# Patient Record
Sex: Female | Born: 1945 | Race: White | Hispanic: No | Marital: Married | Smoking: Never smoker
Health system: Southern US, Community
[De-identification: ages and names within clinical notes are randomized; demographics above are authoritative.]

## PROBLEM LIST (undated history)

## (undated) DIAGNOSIS — M549 Dorsalgia, unspecified: Secondary | ICD-10-CM

## (undated) DIAGNOSIS — Z860100 Personal history of colon polyps, unspecified: Secondary | ICD-10-CM

## (undated) DIAGNOSIS — M1990 Unspecified osteoarthritis, unspecified site: Secondary | ICD-10-CM

## (undated) HISTORY — PX: ABDOMINAL HYSTERECTOMY: SHX81

## (undated) HISTORY — PX: HAMMER TOE SURGERY: SHX385

## (undated) HISTORY — PX: BACK SURGERY: SHX140

## (undated) HISTORY — PX: CHOLECYSTECTOMY: SHX55

## (undated) HISTORY — PX: COLONOSCOPY: SHX174

## (undated) HISTORY — PX: CATARACT EXTRACTION BILATERAL W/ ANTERIOR VITRECTOMY: SHX1304

---

## 2006-07-19 ENCOUNTER — Inpatient Hospital Stay (HOSPITAL_COMMUNITY): Admission: RE | Admit: 2006-07-19 | Discharge: 2006-07-22 | Payer: Self-pay | Admitting: Neurosurgery

## 2007-02-07 ENCOUNTER — Ambulatory Visit (HOSPITAL_COMMUNITY): Admission: RE | Admit: 2007-02-07 | Discharge: 2007-02-07 | Payer: Self-pay | Admitting: Neurosurgery

## 2007-03-14 ENCOUNTER — Ambulatory Visit (HOSPITAL_COMMUNITY): Admission: RE | Admit: 2007-03-14 | Discharge: 2007-03-15 | Payer: Self-pay | Admitting: Neurosurgery

## 2010-05-16 NOTE — Discharge Summary (Signed)
Evelyn Moore, Evelyn Moore                 ACCOUNT NO.:  [id number]   MEDICAL RECORD NO.:  [id number]          PATIENT TYPE:  INP   LOCATION:  3009                         FACILITY:  [facility]   PHYSICIAN:  Payton Doughty, M.D.      DATE OF BIRTH:  06-May-1945   DATE OF ADMISSION:  07/19/2006  DATE OF DISCHARGE:  07/22/2006                               DISCHARGE SUMMARY   ADMISSION DIAGNOSIS:  Spondylosis L4-5.   DISCHARGE DIAGNOSIS:  Spondylosis L4-5.   PROCEDURE:  L4-5 fusion by Dr. Franky Macho.   COMPLICATIONS:  None.  Discharged satisfied and well.   BODY OF TEXT:  This is a 65 year old right-handed white lady with severe  back pain and spondylosis and facet arthropathy at L4-5.  She was  admitted after ascertaining abnormal laboratory values and underwent  fusion by Dr. Franky Macho at L4-5.  Postoperatively she has done well.  Foley was removed first postoperative day.  The PSA was stopped the  second postoperative day, now she put on oral Percocet.  Up, eating, and  voiding normally.  Her strength is 4.  Incision is dry and well-healing.  She will be discharged home in the care of her family.  Her followup  will be in the [facility] in about a week with Dr. Franky Macho.           ______________________________  Payton Doughty, M.D.     MWR/MEDQ  D:  07/22/2006  T:  07/22/2006  Job:  [id number]

## 2010-05-16 NOTE — Op Note (Signed)
NAME:  Evelyn Moore, Evelyn Moore                 ACCOUNT NO.:  [id number]   MEDICAL RECORD NO.:  [id number]          PATIENT TYPE:  INP   LOCATION:  3172                         FACILITY:  [facility]   PHYSICIAN:  Coletta Memos, M.D.     DATE OF BIRTH:  09/13/1945   DATE OF PROCEDURE:  07/19/2006  DATE OF DISCHARGE:                               OPERATIVE REPORT   PREOPERATIVE DIAGNOSES:  1. Lumbar spondylolisthesis, acquired, L4-5.  2. Lumbar stenosis.  3. Lumbar radiculopathy.   POSTOPERATIVE DIAGNOSES:  1. Lumbar spondylolisthesis, acquired, L4-5.  2. Lumbar stenosis.  3. Lumbar radiculopathy.   PROCEDURE:  1. L4-5 posterior lumbar interbody arthrodesis with 13 mm x 2 Synthes      cages.  2. Posterolateral arthrodesis, L4-5 with morselized autograft.  3. Pedicle screw fixation nonsegmental, L4-L5 with a legacy system.   COMPLICATIONS:  None.   SURGEON:  Coletta Memos, M.D.   ASSISTANT:  Payton Doughty, M.D.   INDICATIONS:  Evelyn Moore is a 65 year old who presented with severe  pain in the back and left lower extremity.  She was in such discomfort  that she truly wanted to get this procedure done as soon as she could.  She had the lumbar spondylolisthesis L4 and  L5, incompetent facettes at  L4-5, significant foraminal narrowing, especially on the left L4 nerve  root.  I therefore offered and she agreed to undergo operative  decompression and subsequent fusion.   [name] is brought to the operating room intubated and placed under  general anesthetic without difficulty.  She had a Foley catheter placed  under sterile conditions.  She was rolled prone onto a Wilson frame and  all pressure points were padded.  Back was prepped and she was draped in  a sterile fashion.  I opened the skin with a #10 blade.  I took this  down to the thoracolumbar fascia.  I then exposed the lamina  subsequently of L3, L4, L5 and of the sacrum.  X-ray showed that I was  in the correct position, which I did on two  occasions, one with a probe  into the disk space at L4-5.  I then proceeded to define the transverse  processes of L4 and of L5 and that was done without great difficulty.  I  then proceeded with full decompression of the L4-5 space by doing a  complete laminectomy of L4.  I did this in order to fully decompress  both L4 and L5 nerve roots as I did on both sides.  There was a great  deal of spondylitic change and redundant ligament present.   I then opened the disk space and removed the disk material on both sides  quite aggressively.  I then was able to prepare the endplates for  arthrodesis.  Using both morselized auto and allograft 5 mL of Vitoss, I  packed two 13 mm cages and placed those into the disk space, one on the  right, one on the left without difficulty.  X-ray showed the cages to be  in good position.  I then packed bone  lateral to each of the cages using  all 5 mL of the Vitoss.  I then prepared for the posterolateral  arthrodesis.  I decorticated the lateral bone at L4 and at L5 along with  the transverse processes and placed morselized autograft.   I then placed the posterior instrumentation.  This was done with  fluoroscopic guidance.  I first used a awl and then a pedicle probe and  then a tap.  At all points I did not detect a break out.  X-ray showed  that screws were all in good position.  I used four 45 mm, 5 x 5 screws,  two in L4, two in L5.  I connected those with rod and secured that with  locking screws.  X-ray showed that all screws were in good position.  I  then irrigated the wound.  I then closed the wound in a layered fashion  using Vicryl sutures to reapproximate the thoracolumbar, subcutaneous,  subcuticular layers.  Dermabond used for sterile dressing.           ______________________________  Coletta Memos, M.D.     KC/MEDQ  D:  07/19/2006  T:  07/20/2006  Job:  [id number]

## 2010-05-16 NOTE — Op Note (Signed)
NAME:  Evelyn, Moore                 ACCOUNT NO.:  [id number]   MEDICAL RECORD NO.:  [id number]          PATIENT TYPE:  INP   LOCATION:  3523                         FACILITY:  [facility]   PHYSICIAN:  Coletta Memos, M.D.     DATE OF BIRTH:  05-16-1945   DATE OF PROCEDURE:  03/14/2007  DATE OF DISCHARGE:  03/15/2007                               OPERATIVE REPORT   PREOPERATIVE DIAGNOSIS:  Low back pain.   POSTOPERATIVE DIAGNOSIS:  Low back pain.   PROCEDURE:  Hardware removal pedicle screws at L4-5 along with rods.   COMPLICATIONS:  None.   SURGEON:  Coletta Memos, M.D.   ASSISTANT:  None.   ANESTHESIA:  General endotracheal.   INDICATIONS:  Evelyn Moore is a patient who underwent a lumbar fusion  with interbody cages and pedicle screw fixation the summer of 2008.  She  has a solid fusion based on a CT reading, but she continues complained  of bilateral hip pain.  The right L4 screws seem to get into the joint.  I felt that if I removed it she may have some relief of her pain as she  already has a solid fusion.  She agreed and is brought to the operating  room today for the procedure.   OPERATIVE NOTE:  Evelyn Moore was brought to the operating room, intubated,  placed under general anesthetic.  She was rolled prone onto a Wilson  frame, all pressure points properly padded.  Back prepped.  She was  draped in a sterile fashion.  I opened the skin with a #10 blade and  took this down to the thoracolumbar fascia.  I then exposed the lamina  of L3 and some of L5.  Using x-ray, I was then able to localize the  hardware.  I removed the hardware on both sides without difficulty.  I  then injected 40 mL 0.5% lidocaine 1:200,000 strength epinephrine into  the paraspinous musculature and subcutaneous tissue.  I then closed the  wound in sterile fashion using Vicryl sutures to reapproximate the  thoracolumbar fascia, subcutaneous, subcuticular layers.  I used  Dermabond for sterile dressing.           ______________________________  Coletta Memos, M.D.     KC/MEDQ  D:  03/14/2007  T:  03/15/2007  Job:  [id number]

## 2010-09-25 LAB — BASIC METABOLIC PANEL
BUN: 13
CO2: 31
Calcium: 9.5
Chloride: 100
Creatinine, Ser: 0.87
GFR calc Af Amer: >60
Glucose, Bld: 120 — ABNORMAL HIGH

## 2010-09-25 LAB — CBC
MCHC: 35.3
MCV: 93.2
Platelets: 173
RBC: 4.79
RDW: 13.7

## 2010-10-16 LAB — POTASSIUM: Potassium: 3.3 — ABNORMAL LOW

## 2010-10-17 LAB — CBC
HCT: 43.5
MCHC: 35.4
MCV: 94.5
Platelets: 204
WBC: 11.6 — ABNORMAL HIGH

## 2010-10-17 LAB — BASIC METABOLIC PANEL
BUN: 17
CO2: 31
Chloride: 98
Creatinine, Ser: 0.85
Potassium: 3 — ABNORMAL LOW

## 2010-10-17 LAB — ABO/RH: ABO/RH(D): A POS

## 2010-10-17 LAB — TYPE AND SCREEN: ABO/RH(D): A POS

## 2012-02-06 ENCOUNTER — Other Ambulatory Visit (HOSPITAL_COMMUNITY): Payer: Self-pay | Admitting: Neurological Surgery

## 2012-02-06 DIAGNOSIS — M545 Low back pain: Secondary | ICD-10-CM

## 2012-02-11 ENCOUNTER — Encounter (HOSPITAL_COMMUNITY): Payer: Self-pay | Admitting: *Deleted

## 2012-02-11 NOTE — Progress Notes (Signed)
Pt doesn't have a cardiologist  Denies ever having an echo/stress test/heart cath  Medical Md is in [place]-Dr.Dwight [place]  Denies EKG or CXR in past yr

## 2012-02-12 ENCOUNTER — Encounter (HOSPITAL_COMMUNITY): Payer: Self-pay | Admitting: Anesthesiology

## 2012-02-12 ENCOUNTER — Encounter (HOSPITAL_COMMUNITY)
Admission: RE | Disposition: A | Discharge status: Home / Self Care | Payer: Self-pay | Source: Ambulatory Visit | Attending: Neurological Surgery

## 2012-02-12 ENCOUNTER — Ambulatory Visit (HOSPITAL_COMMUNITY)
Admission: RE | Admit: 2012-02-12 | Discharge: 2012-02-12 | Disposition: A | Discharge status: Home / Self Care | Payer: Medicare Other | Source: Ambulatory Visit | Attending: Neurological Surgery | Admitting: Neurological Surgery

## 2012-02-12 ENCOUNTER — Ambulatory Visit (HOSPITAL_COMMUNITY): Payer: Medicare Other | Admitting: Anesthesiology

## 2012-02-12 ENCOUNTER — Encounter (HOSPITAL_COMMUNITY): Payer: Self-pay | Admitting: *Deleted

## 2012-02-12 ENCOUNTER — Encounter (HOSPITAL_COMMUNITY): Payer: Self-pay

## 2012-02-12 DIAGNOSIS — M545 Low back pain, unspecified: Secondary | ICD-10-CM | POA: Insufficient documentation

## 2012-02-12 DIAGNOSIS — M519 Unspecified thoracic, thoracolumbar and lumbosacral intervertebral disc disorder: Secondary | ICD-10-CM | POA: Insufficient documentation

## 2012-02-12 DIAGNOSIS — M5126 Other intervertebral disc displacement, lumbar region: Secondary | ICD-10-CM | POA: Insufficient documentation

## 2012-02-12 DIAGNOSIS — Z981 Arthrodesis status: Secondary | ICD-10-CM | POA: Insufficient documentation

## 2012-02-12 DIAGNOSIS — M51379 Other intervertebral disc degeneration, lumbosacral region without mention of lumbar back pain or lower extremity pain: Secondary | ICD-10-CM | POA: Insufficient documentation

## 2012-02-12 DIAGNOSIS — M5137 Other intervertebral disc degeneration, lumbosacral region: Secondary | ICD-10-CM | POA: Insufficient documentation

## 2012-02-12 HISTORY — DX: Unspecified osteoarthritis, unspecified site: M19.90

## 2012-02-12 HISTORY — DX: Personal history of colon polyps, unspecified: Z86.0100

## 2012-02-12 HISTORY — DX: Personal history of colonic polyps: Z86.010

## 2012-02-12 HISTORY — DX: Dorsalgia, unspecified: M54.9

## 2012-02-12 HISTORY — PX: RADIOLOGY WITH ANESTHESIA: SHX6223

## 2012-02-12 SURGERY — Surgical Case
Anesthesia: *Unknown

## 2012-02-12 SURGERY — RADIOLOGY WITH ANESTHESIA
Anesthesia: Monitor Anesthesia Care

## 2012-02-12 MED ORDER — LACTATED RINGERS IV SOLN
INTRAVENOUS | Status: DC
  Administered 2012-02-12: 14:00:00 via INTRAVENOUS

## 2012-02-12 MED ORDER — HYDROMORPHONE HCL PF 1 MG/ML IJ SOLN: 0.2500 mg | INTRAMUSCULAR | Status: DC | PRN

## 2012-02-12 MED ORDER — HYDROMORPHONE HCL PF 1 MG/ML IJ SOLN
INTRAMUSCULAR | Status: AC
  Filled 2012-02-12: qty 1

## 2012-02-12 MED ORDER — ONDANSETRON HCL 4 MG/2ML IJ SOLN
INTRAMUSCULAR | Status: AC
  Filled 2012-02-12: qty 2

## 2012-02-12 MED ORDER — MEPERIDINE HCL 25 MG/ML IJ SOLN: 6.2500 mg | INTRAMUSCULAR | Status: DC | PRN

## 2012-02-12 MED ORDER — OXYCODONE HCL 5 MG/5ML PO SOLN: 5.0000 mg | Freq: Once | ORAL | Status: DC | PRN

## 2012-02-12 MED ORDER — PROMETHAZINE HCL 25 MG/ML IJ SOLN: 6.2500 mg | INTRAMUSCULAR | Status: DC | PRN

## 2012-02-12 MED ORDER — ONDANSETRON HCL 4 MG/2ML IJ SOLN
4.0000 mg | Freq: Once | INTRAMUSCULAR | Status: AC | PRN
  Administered 2012-02-12: 4 mg via INTRAVENOUS

## 2012-02-12 MED ORDER — GADOBENATE DIMEGLUMINE 529 MG/ML IV SOLN
20.0000 mL | Freq: Once | INTRAVENOUS | Status: AC
  Administered 2012-02-12: 20 mL via INTRAVENOUS

## 2012-02-12 MED ORDER — OXYCODONE HCL 5 MG PO TABS: 5.0000 mg | ORAL_TABLET | Freq: Once | ORAL | Status: DC | PRN

## 2012-02-12 MED ORDER — HYDROMORPHONE HCL PF 1 MG/ML IJ SOLN
0.2500 mg | INTRAMUSCULAR | Status: DC | PRN
  Administered 2012-02-12: 0.5 mg via INTRAVENOUS

## 2012-02-12 NOTE — Preoperative (Signed)
Beta Blockers   Reason not to administer Beta Blockers:Not Applicable 

## 2012-02-12 NOTE — Anesthesia Preprocedure Evaluation (Addendum)
Anesthesia Evaluation  Patient identified by MRN, date of birth, ID band Patient awake    Reviewed: Allergy & Precautions, H&P , NPO status , Patient's Chart, lab work & pertinent test results, reviewed documented beta blocker date and time , Unable to perform ROS - Chart review only  Airway Mallampati: II TM Distance: >3 FB Neck ROM: Full    Dental  (+) Dental Advisory Given and Teeth Intact   Pulmonary neg pulmonary ROS,    Pulmonary exam normal       Cardiovascular negative cardio ROS      Neuro/Psych negative psych ROS   GI/Hepatic negative GI ROS, Neg liver ROS,   Endo/Other  negative endocrine ROS  Renal/GU negative Renal ROS     Musculoskeletal   Abdominal   Peds  Hematology negative hematology ROS (+)   Anesthesia Other Findings   Reproductive/Obstetrics                         Anesthesia Physical Anesthesia Plan  ASA: II  Anesthesia Plan: MAC   Post-op Pain Management:    Induction: Intravenous  Airway Management Planned: LMA  Additional Equipment:   Intra-op Plan:   Post-operative Plan: Extubation in OR  Informed Consent: I have reviewed the patients History and Physical, chart, labs and discussed the procedure including the risks, benefits and alternatives for the proposed anesthesia with the patient or authorized representative who has indicated his/her understanding and acceptance.   Dental advisory given  Plan Discussed with: CRNA and Surgeon  Anesthesia Plan Comments:       Anesthesia Quick Evaluation

## 2012-02-14 ENCOUNTER — Encounter (HOSPITAL_COMMUNITY): Payer: Self-pay | Admitting: Radiology

## 2012-02-22 ENCOUNTER — Other Ambulatory Visit: Payer: Self-pay | Admitting: Neurological Surgery

## 2012-02-29 ENCOUNTER — Encounter (HOSPITAL_COMMUNITY): Payer: Self-pay | Admitting: Pharmacy Technician

## 2012-03-05 NOTE — Pre-Procedure Instructions (Signed)
Letitia Sabala Weismann  03/05/2012   Your procedure is scheduled on:  Thursday  03/13/12  Report to [facility] at 530  AM.  Call this number if you have problems the morning of surgery: [phone]   Remember:   Do not eat food or drink liquids after midnight.   Take these medicines the morning of surgery with A SIP OF WATER:none   Do not wear jewelry, make-up or nail polish.  Do not wear lotions, powders, or perfumes. You may wear deodorant.  Do not shave 48 hours prior to surgery. Men may shave face and neck.  Do not bring valuables to the hospital.  Contacts, dentures or bridgework may not be worn into surgery.  Leave suitcase in the car. After surgery it may be brought to your room.   For patients admitted to the hospital, checkout time is 11:00 AM the day of  discharge.   Patients discharged the day of surgery will not be allowed to drive  home.     Special Instructions: Shower using CHG 2 nights before surgery and the night before surgery.  If you shower the day of surgery use CHG.  Use special wash - you have one bottle of CHG for all showers.  You should use approximately 1/3 of the bottle for each shower.   Please read over the following fact sheets that you were given: Pain Booklet, Blood Transfusion Information, MRSA Information and Surgical Site Infection Prevention

## 2012-03-06 ENCOUNTER — Encounter (HOSPITAL_COMMUNITY): Payer: Self-pay

## 2012-03-06 ENCOUNTER — Ambulatory Visit (HOSPITAL_COMMUNITY)
Admission: RE | Admit: 2012-03-06 | Discharge: 2012-03-06 | Disposition: A | Discharge status: Home / Self Care | Payer: Medicare Other | Source: Ambulatory Visit | Attending: Neurological Surgery | Admitting: Neurological Surgery

## 2012-03-06 ENCOUNTER — Encounter (HOSPITAL_COMMUNITY)
Admission: RE | Admit: 2012-03-06 | Discharge: 2012-03-06 | Disposition: A | Discharge status: Home / Self Care | Payer: Medicare Other | Source: Ambulatory Visit | Attending: Neurological Surgery | Admitting: Neurological Surgery

## 2012-03-06 DIAGNOSIS — Z01812 Encounter for preprocedural laboratory examination: Secondary | ICD-10-CM | POA: Insufficient documentation

## 2012-03-06 DIAGNOSIS — R9431 Abnormal electrocardiogram [ECG] [EKG]: Secondary | ICD-10-CM | POA: Insufficient documentation

## 2012-03-06 DIAGNOSIS — Z01818 Encounter for other preprocedural examination: Secondary | ICD-10-CM | POA: Insufficient documentation

## 2012-03-06 DIAGNOSIS — Z01810 Encounter for preprocedural cardiovascular examination: Secondary | ICD-10-CM | POA: Insufficient documentation

## 2012-03-06 LAB — TYPE AND SCREEN
ABO/RH(D): A POS
Antibody Screen: NEGATIVE

## 2012-03-06 LAB — PROTIME-INR
INR: 0.97 (ref 0.00–1.49)
Prothrombin Time: 12.8 seconds (ref 11.6–15.2)

## 2012-03-06 LAB — BASIC METABOLIC PANEL
Chloride: 102 mEq/L (ref 96–112)
Creatinine, Ser: 0.95 mg/dL (ref 0.50–1.10)
GFR calc Af Amer: 70 mL/min — ABNORMAL LOW (ref >90)
GFR calc non Af Amer: 60 mL/min — ABNORMAL LOW (ref >90)
Potassium: 3.9 mEq/L (ref 3.5–5.1)

## 2012-03-06 LAB — CBC WITH DIFFERENTIAL/PLATELET
Basophils Absolute: 0 10*3/uL (ref 0.0–0.1)
Basophils Relative: 1 % (ref 0–1)
Eosinophils Absolute: 0.2 10*3/uL (ref 0.0–0.7)
Eosinophils Relative: 2 % (ref 0–5)
HCT: 45.3 % (ref 36.0–46.0)
Hemoglobin: 16.8 g/dL — ABNORMAL HIGH (ref 12.0–15.0)
Lymphocytes Relative: 19 % (ref 12–46)
Lymphs Abs: 1.4 10*3/uL (ref 0.7–4.0)
MCH: 33 pg (ref 26.0–34.0)
MCHC: 37.1 g/dL — ABNORMAL HIGH (ref 30.0–36.0)
MCV: 89 fL (ref 78.0–100.0)
Monocytes Absolute: 0.4 10*3/uL (ref 0.1–1.0)
Monocytes Relative: 6 % (ref 3–12)
Neutro Abs: 5.4 10*3/uL (ref 1.7–7.7)
Neutrophils Relative %: 72 % (ref 43–77)
Platelets: 154 10*3/uL (ref 150–400)
RBC: 5.09 MIL/uL (ref 3.87–5.11)
RDW: 13.4 % (ref 11.5–15.5)
WBC: 7.4 10*3/uL (ref 4.0–10.5)

## 2012-03-06 LAB — SURGICAL PCR SCREEN
MRSA, PCR: NEGATIVE
Staphylococcus aureus: NEGATIVE

## 2012-03-12 MED ORDER — DEXAMETHASONE SODIUM PHOSPHATE 10 MG/ML IJ SOLN
10.0000 mg | INTRAMUSCULAR | Status: AC
  Administered 2012-03-13: 10 mg via INTRAVENOUS
  Filled 2012-03-12: qty 1

## 2012-03-12 MED ORDER — CEFAZOLIN SODIUM-DEXTROSE 2-3 GM-% IV SOLR
2.0000 g | INTRAVENOUS | Status: AC
  Administered 2012-03-13: 2 g via INTRAVENOUS
  Filled 2012-03-12: qty 50

## 2012-03-13 ENCOUNTER — Inpatient Hospital Stay (HOSPITAL_COMMUNITY): Payer: Medicare Other | Admitting: Anesthesiology

## 2012-03-13 ENCOUNTER — Encounter (HOSPITAL_COMMUNITY)
Admission: RE | Disposition: A | Discharge status: Home / Self Care | Payer: Self-pay | Source: Ambulatory Visit | Attending: Neurological Surgery

## 2012-03-13 ENCOUNTER — Inpatient Hospital Stay (HOSPITAL_COMMUNITY)
Admission: RE | Admit: 2012-03-13 | Discharge: 2012-03-14 | DRG: 460 | Disposition: A | Discharge status: Home / Self Care | Payer: Medicare Other | Source: Ambulatory Visit | Attending: Neurological Surgery | Admitting: Neurological Surgery

## 2012-03-13 ENCOUNTER — Encounter (HOSPITAL_COMMUNITY): Payer: Self-pay | Admitting: Anesthesiology

## 2012-03-13 ENCOUNTER — Inpatient Hospital Stay (HOSPITAL_COMMUNITY): Payer: Medicare Other

## 2012-03-13 ENCOUNTER — Encounter (HOSPITAL_COMMUNITY): Payer: Self-pay

## 2012-03-13 DIAGNOSIS — M4120 Other idiopathic scoliosis, site unspecified: Secondary | ICD-10-CM | POA: Diagnosis present

## 2012-03-13 DIAGNOSIS — M51379 Other intervertebral disc degeneration, lumbosacral region without mention of lumbar back pain or lower extremity pain: Principal | ICD-10-CM | POA: Diagnosis present

## 2012-03-13 DIAGNOSIS — Z981 Arthrodesis status: Secondary | ICD-10-CM

## 2012-03-13 DIAGNOSIS — M5137 Other intervertebral disc degeneration, lumbosacral region: Principal | ICD-10-CM | POA: Diagnosis present

## 2012-03-13 LAB — POCT I-STAT 4, (NA,K, GLUC, HGB,HCT)
HCT: 32 % — ABNORMAL LOW (ref 36.0–46.0)
Hemoglobin: 10.9 g/dL — ABNORMAL LOW (ref 12.0–15.0)
Potassium: 3.9 mEq/L (ref 3.5–5.1)
Sodium: 136 mEq/L (ref 135–145)

## 2012-03-13 SURGERY — FOR MAXIMUM ACCESS (MAS) POSTERIOR LUMBAR INTERBODY FUSION (PLIF) 2 LEVEL
Anesthesia: General | Site: Back | Wound class: Clean

## 2012-03-13 MED ORDER — SUCCINYLCHOLINE CHLORIDE 20 MG/ML IJ SOLN: INTRAMUSCULAR | Status: DC | PRN

## 2012-03-13 MED ORDER — PROPOFOL 10 MG/ML IV BOLUS: INTRAVENOUS | Status: DC | PRN

## 2012-03-13 MED ORDER — SODIUM CHLORIDE 0.9 % IR SOLN
Status: DC | PRN
  Administered 2012-03-13: 09:00:00

## 2012-03-13 MED ORDER — ACETAMINOPHEN 10 MG/ML IV SOLN
1000.0000 mg | Freq: Four times a day (QID) | INTRAVENOUS | Status: DC
Start: 2012-03-13 — End: 2012-03-14
  Administered 2012-03-13 – 2012-03-14 (×2): 1000 mg via INTRAVENOUS
  Filled 2012-03-13 (×5): qty 100

## 2012-03-13 MED ORDER — PROPOFOL 10 MG/ML IV BOLUS
INTRAVENOUS | Status: DC | PRN
  Administered 2012-03-13: 200 mg via INTRAVENOUS

## 2012-03-13 MED ORDER — BUPIVACAINE HCL (PF) 0.25 % IJ SOLN
INTRAMUSCULAR | Status: DC | PRN
  Administered 2012-03-13: 7 mL

## 2012-03-13 MED ORDER — CEFAZOLIN SODIUM 1-5 GM-% IV SOLN
1.0000 g | Freq: Three times a day (TID) | INTRAVENOUS | Status: AC
  Administered 2012-03-13 – 2012-03-14 (×2): 1 g via INTRAVENOUS
  Filled 2012-03-13 (×2): qty 50

## 2012-03-13 MED ORDER — THROMBIN 5000 UNITS EX SOLR
OROMUCOSAL | Status: DC | PRN
  Administered 2012-03-13: 10:00:00 via TOPICAL

## 2012-03-13 MED ORDER — LIDOCAINE HCL (CARDIAC) 20 MG/ML IV SOLN
INTRAVENOUS | Status: DC | PRN
  Administered 2012-03-13: 100 mg via INTRAVENOUS

## 2012-03-13 MED ORDER — ACETAMINOPHEN 325 MG PO TABS: 650.0000 mg | ORAL_TABLET | ORAL | Status: DC | PRN

## 2012-03-13 MED ORDER — FENTANYL CITRATE 0.05 MG/ML IJ SOLN
INTRAMUSCULAR | Status: DC | PRN
  Administered 2012-03-13: 50 ug via INTRAVENOUS
  Administered 2012-03-13: 100 ug via INTRAVENOUS
  Administered 2012-03-13: 50 ug via INTRAVENOUS
  Administered 2012-03-13 (×2): 100 ug via INTRAVENOUS
  Administered 2012-03-13: 50 ug via INTRAVENOUS

## 2012-03-13 MED ORDER — ONDANSETRON HCL 4 MG/2ML IJ SOLN: 4.0000 mg | INTRAMUSCULAR | Status: DC | PRN

## 2012-03-13 MED ORDER — METOCLOPRAMIDE HCL 5 MG/ML IJ SOLN
INTRAMUSCULAR | Status: DC | PRN
  Administered 2012-03-13: 10 mg via INTRAVENOUS

## 2012-03-13 MED ORDER — SUCCINYLCHOLINE CHLORIDE 20 MG/ML IJ SOLN
INTRAMUSCULAR | Status: DC | PRN
  Administered 2012-03-13: 140 mg via INTRAVENOUS

## 2012-03-13 MED ORDER — LIDOCAINE HCL (CARDIAC) 20 MG/ML IV SOLN: INTRAVENOUS | Status: DC | PRN

## 2012-03-13 MED ORDER — POTASSIUM CHLORIDE IN NACL 20-0.9 MEQ/L-% IV SOLN
INTRAVENOUS | Status: DC
  Administered 2012-03-13: 22:00:00 via INTRAVENOUS
  Filled 2012-03-13 (×2): qty 1000

## 2012-03-13 MED ORDER — LACTATED RINGERS IV SOLN
INTRAVENOUS | Status: DC | PRN
  Administered 2012-03-13 (×3): via INTRAVENOUS

## 2012-03-13 MED ORDER — SODIUM CHLORIDE 0.9 % IV SOLN: 250.0000 mL | INTRAVENOUS | Status: DC

## 2012-03-13 MED ORDER — SODIUM CHLORIDE 0.9 % IJ SOLN: 3.0000 mL | INTRAMUSCULAR | Status: DC | PRN

## 2012-03-13 MED ORDER — SCOPOLAMINE 1 MG/3DAYS TD PT72
1.0000 | MEDICATED_PATCH | TRANSDERMAL | Status: DC
  Filled 2012-03-13: qty 1

## 2012-03-13 MED ORDER — METOCLOPRAMIDE HCL 5 MG/ML IJ SOLN: 10.0000 mg | Freq: Once | INTRAMUSCULAR | Status: DC | PRN

## 2012-03-13 MED ORDER — CEFAZOLIN SODIUM-DEXTROSE 2-3 GM-% IV SOLR
INTRAVENOUS | Status: AC
  Administered 2012-03-13: 2 g via INTRAVENOUS
  Filled 2012-03-13: qty 50

## 2012-03-13 MED ORDER — CELECOXIB 200 MG PO CAPS
200.0000 mg | ORAL_CAPSULE | Freq: Two times a day (BID) | ORAL | Status: DC
  Administered 2012-03-13: 200 mg via ORAL
  Filled 2012-03-13 (×3): qty 1

## 2012-03-13 MED ORDER — ACETAMINOPHEN 10 MG/ML IV SOLN
INTRAVENOUS | Status: AC
  Administered 2012-03-13: 1000 mg via INTRAVENOUS
  Filled 2012-03-13: qty 100

## 2012-03-13 MED ORDER — BACITRACIN 50000 UNITS IM SOLR
INTRAMUSCULAR | Status: AC
  Filled 2012-03-13: qty 1

## 2012-03-13 MED ORDER — OXYCODONE-ACETAMINOPHEN 5-325 MG PO TABS
1.0000 | ORAL_TABLET | ORAL | Status: DC | PRN
  Administered 2012-03-13: 2 via ORAL
  Filled 2012-03-13: qty 2

## 2012-03-13 MED ORDER — ALBUMIN HUMAN 5 % IV SOLN
INTRAVENOUS | Status: DC | PRN
  Administered 2012-03-13 (×2): via INTRAVENOUS

## 2012-03-13 MED ORDER — MENTHOL 3 MG MT LOZG: 1.0000 | LOZENGE | OROMUCOSAL | Status: DC | PRN

## 2012-03-13 MED ORDER — PROPOFOL INFUSION 10 MG/ML OPTIME
INTRAVENOUS | Status: DC | PRN
  Administered 2012-03-13: 50 ug/kg/min via INTRAVENOUS

## 2012-03-13 MED ORDER — 0.9 % SODIUM CHLORIDE (POUR BTL) OPTIME
TOPICAL | Status: DC | PRN
  Administered 2012-03-13: 1000 mL

## 2012-03-13 MED ORDER — MIDAZOLAM HCL 5 MG/5ML IJ SOLN
INTRAMUSCULAR | Status: DC | PRN
  Administered 2012-03-13 (×2): 1 mg via INTRAVENOUS

## 2012-03-13 MED ORDER — ONDANSETRON HCL 4 MG/2ML IJ SOLN
INTRAMUSCULAR | Status: DC | PRN
  Administered 2012-03-13: 4 mg via INTRAVENOUS

## 2012-03-13 MED ORDER — SODIUM CHLORIDE 0.9 % IV SOLN
INTRAVENOUS | Status: AC
  Filled 2012-03-13: qty 500

## 2012-03-13 MED ORDER — DEXTROSE 5 % IV SOLN
INTRAVENOUS | Status: DC | PRN
  Administered 2012-03-13: 08:00:00 via INTRAVENOUS

## 2012-03-13 MED ORDER — MORPHINE SULFATE 2 MG/ML IJ SOLN
1.0000 mg | INTRAMUSCULAR | Status: DC | PRN
  Filled 2012-03-13: qty 1

## 2012-03-13 MED ORDER — PHENOL 1.4 % MT LIQD: 1.0000 | OROMUCOSAL | Status: DC | PRN

## 2012-03-13 MED ORDER — MORPHINE SULFATE 2 MG/ML IJ SOLN: 1.0000 mg | INTRAMUSCULAR | Status: DC | PRN

## 2012-03-13 MED ORDER — DEXAMETHASONE 4 MG PO TABS
4.0000 mg | ORAL_TABLET | Freq: Four times a day (QID) | ORAL | Status: DC
  Administered 2012-03-13 – 2012-03-14 (×3): 4 mg via ORAL
  Filled 2012-03-13 (×6): qty 1

## 2012-03-13 MED ORDER — SODIUM CHLORIDE 0.9 % IJ SOLN: 3.0000 mL | Freq: Two times a day (BID) | INTRAMUSCULAR | Status: DC

## 2012-03-13 MED ORDER — ARTIFICIAL TEARS OP OINT
TOPICAL_OINTMENT | OPHTHALMIC | Status: DC | PRN
  Administered 2012-03-13: 1 via OPHTHALMIC

## 2012-03-13 MED ORDER — SCOPOLAMINE 1 MG/3DAYS TD PT72
MEDICATED_PATCH | TRANSDERMAL | Status: DC | PRN
  Administered 2012-03-13: 1 via TRANSDERMAL

## 2012-03-13 MED ORDER — THROMBIN 20000 UNITS EX SOLR
OROMUCOSAL | Status: DC | PRN
  Administered 2012-03-13: 09:00:00 via TOPICAL

## 2012-03-13 MED ORDER — SENNA 8.6 MG PO TABS
1.0000 | ORAL_TABLET | Freq: Two times a day (BID) | ORAL | Status: DC
  Administered 2012-03-13 – 2012-03-14 (×2): 8.6 mg via ORAL
  Filled 2012-03-13 (×2): qty 1

## 2012-03-13 MED ORDER — DEXAMETHASONE SODIUM PHOSPHATE 4 MG/ML IJ SOLN
4.0000 mg | Freq: Four times a day (QID) | INTRAMUSCULAR | Status: DC
  Filled 2012-03-13 (×4): qty 1

## 2012-03-13 MED ORDER — ACETAMINOPHEN 650 MG RE SUPP: 650.0000 mg | RECTAL | Status: DC | PRN

## 2012-03-13 SURGICAL SUPPLY — 70 items
5.0x25mm MAS PLIF Screw ×2 IMPLANT
BAG DECANTER FOR FLEXI CONT (MISCELLANEOUS) ×2 IMPLANT
BENZOIN TINCTURE PRP APPL 2/3 (GAUZE/BANDAGES/DRESSINGS) ×2 IMPLANT
BLADE SURG ROTATE 9660 (MISCELLANEOUS) IMPLANT
BONE MATRIX OSTEOCEL PLUS 10CC (Bone Implant) ×2 IMPLANT
BUR MATCHSTICK NEURO 3.0 LAGG (BURR) ×2 IMPLANT
CAGE PLIF MAS 9X8X28-4 LUMBAR (Cage) ×4 IMPLANT
CANISTER SUCTION 2500CC (MISCELLANEOUS) ×2 IMPLANT
CLIP NEUROVISION LG (CLIP) ×2 IMPLANT
CLOTH BEACON ORANGE TIMEOUT ST (SAFETY) ×2 IMPLANT
CONT SPEC 4OZ CLIKSEAL STRL BL (MISCELLANEOUS) ×4 IMPLANT
COVER BACK TABLE 24X17X13 BIG (DRAPES) IMPLANT
COVER TABLE BACK 60X90 (DRAPES) ×2 IMPLANT
Coroent Large 8x9x23 8 degree ×4 IMPLANT
DRAPE C-ARM 42X72 X-RAY (DRAPES) ×2 IMPLANT
DRAPE C-ARMOR (DRAPES) ×2 IMPLANT
DRAPE LAPAROTOMY 100X72X124 (DRAPES) ×2 IMPLANT
DRAPE POUCH INSTRU U-SHP 10X18 (DRAPES) ×2 IMPLANT
DRAPE SURG 17X23 STRL (DRAPES) ×2 IMPLANT
DRESSING TELFA 8X3 (GAUZE/BANDAGES/DRESSINGS) ×2 IMPLANT
DRSG OPSITE 4X5.5 SM (GAUZE/BANDAGES/DRESSINGS) ×6 IMPLANT
DURAPREP 26ML APPLICATOR (WOUND CARE) ×2 IMPLANT
ELECT REM PT RETURN 9FT ADLT (ELECTROSURGICAL) ×2
ELECTRODE REM PT RTRN 9FT ADLT (ELECTROSURGICAL) ×1 IMPLANT
EVACUATOR 1/8 PVC DRAIN (DRAIN) ×2 IMPLANT
GAUZE SPONGE 4X4 16PLY XRAY LF (GAUZE/BANDAGES/DRESSINGS) IMPLANT
GLOVE BIO SURGEON STRL SZ8 (GLOVE) ×4 IMPLANT
GLOVE BIO SURGEON STRL SZ8.5 (GLOVE) ×2 IMPLANT
GLOVE BIOGEL PI IND STRL 7.0 (GLOVE) ×1 IMPLANT
GLOVE BIOGEL PI IND STRL 8 (GLOVE) ×1 IMPLANT
GLOVE BIOGEL PI INDICATOR 7.0 (GLOVE) ×1
GLOVE BIOGEL PI INDICATOR 8 (GLOVE) ×1
GLOVE OPTIFIT SS 8.0 STRL (GLOVE) ×2 IMPLANT
GLOVE SS BIOGEL STRL SZ 6.5 (GLOVE) ×2 IMPLANT
GLOVE SUPERSENSE BIOGEL SZ 6.5 (GLOVE) ×2
GOWN BRE IMP SLV AUR LG STRL (GOWN DISPOSABLE) ×2 IMPLANT
GOWN BRE IMP SLV AUR XL STRL (GOWN DISPOSABLE) ×6 IMPLANT
GOWN STRL REIN 2XL LVL4 (GOWN DISPOSABLE) IMPLANT
HEMOSTAT POWDER KIT SURGIFOAM (HEMOSTASIS) ×2 IMPLANT
KIT BASIN OR (CUSTOM PROCEDURE TRAY) ×2 IMPLANT
KIT NEEDLE NVM5 EMG ELECT (KITS) ×1 IMPLANT
KIT NEEDLE NVM5 EMG ELECTRODE (KITS) ×1
KIT ROOM TURNOVER OR (KITS) ×2 IMPLANT
MILL MEDIUM DISP (BLADE) ×2 IMPLANT
NEEDLE HYPO 25X1 1.5 SAFETY (NEEDLE) ×2 IMPLANT
NS IRRIG 1000ML POUR BTL (IV SOLUTION) ×2 IMPLANT
PACK LAMINECTOMY NEURO (CUSTOM PROCEDURE TRAY) ×2 IMPLANT
PAD ARMBOARD 7.5X6 YLW CONV (MISCELLANEOUS) ×6 IMPLANT
ROD 30MM (Rod) ×2 IMPLANT
ROD 5.5X40MM (Rod) ×4 IMPLANT
ROD PLIF MAS PB SPHERX 30 (Rod) ×2 IMPLANT
SCREW LOCK (Screw) ×8 IMPLANT
SCREW LOCK FXNS SPNE MAS PL (Screw) ×8 IMPLANT
SCREW PLIF MAS 5.0X35 (Screw) ×2 IMPLANT
SCREW SHANK 5.0X30MM (Screw) ×8 IMPLANT
SCREW SHANK 6.5X30 (Screw) ×4 IMPLANT
SCREW TULIP 5.5 (Screw) ×12 IMPLANT
SPONGE LAP 4X18 X RAY DECT (DISPOSABLE) IMPLANT
SPONGE SURGIFOAM ABS GEL 100 (HEMOSTASIS) ×2 IMPLANT
STRIP CLOSURE SKIN 1/2X4 (GAUZE/BANDAGES/DRESSINGS) ×2 IMPLANT
SUT VIC AB 0 CT1 18XCR BRD8 (SUTURE) ×1 IMPLANT
SUT VIC AB 0 CT1 8-18 (SUTURE) ×1
SUT VIC AB 2-0 CP2 18 (SUTURE) ×2 IMPLANT
SUT VIC AB 3-0 SH 8-18 (SUTURE) ×4 IMPLANT
SYR 20ML ECCENTRIC (SYRINGE) ×2 IMPLANT
TOWEL OR 17X24 6PK STRL BLUE (TOWEL DISPOSABLE) ×2 IMPLANT
TOWEL OR 17X26 10 PK STRL BLUE (TOWEL DISPOSABLE) ×2 IMPLANT
TRAP SPECIMEN MUCOUS 40CC (MISCELLANEOUS) ×2 IMPLANT
TRAY FOLEY CATH 14FRSI W/METER (CATHETERS) ×2 IMPLANT
WATER STERILE IRR 1000ML POUR (IV SOLUTION) ×2 IMPLANT

## 2012-03-13 NOTE — Transfer of Care (Signed)
Immediate Anesthesia Transfer of Care Note  Patient: Evelyn Moore  Procedure(s) Performed: Procedure(s) with comments: MAXIMUM ACCESS (MAS) POSTERIOR LUMBAR INTERBODY FUSION (PLIF) 2 LEVEL (C-Arm) (N/A) - Maximum Access Posterior Lumbar Interbody Fusion Lumbar Three-Four, Lumbar Five-Sacral One  Patient Location: PACU  Anesthesia Type:General  Level of Consciousness: oriented, sedated, patient cooperative, responds to stimulation and Patient remains intubated per anesthesia plan  Airway & Oxygen Therapy: Patient Spontanous Breathing, Patient connected to T-piece oxygen and Patient remains intubated per anesthesia plan  Post-op Assessment: Report given to PACU RN and Post -op Vital signs reviewed and stable  Post vital signs: Reviewed and stable  Complications: No apparent anesthesia complications

## 2012-03-13 NOTE — Preoperative (Signed)
Beta Blockers   Reason not to administer Beta Blockers:Not Applicable 

## 2012-03-13 NOTE — Anesthesia Preprocedure Evaluation (Addendum)
Anesthesia Evaluation  Patient identified by MRN, date of birth, ID band Patient awake    Reviewed: Allergy & Precautions, H&P , NPO status , Patient's Chart, lab work & pertinent test results, reviewed documented beta blocker date and time   Airway Mallampati: II TM Distance: >3 FB Neck ROM: full    Dental  (+) Edentulous Upper, Edentulous Lower and Dental Advisory Given   Pulmonary neg pulmonary ROS,  breath sounds clear to auscultation        Cardiovascular negative cardio ROS  Rhythm:regular     Neuro/Psych negative neurological ROS  negative psych ROS   GI/Hepatic negative GI ROS, Neg liver ROS,   Endo/Other  Morbid obesity  Renal/GU negative Renal ROS  negative genitourinary   Musculoskeletal   Abdominal   Peds  Hematology negative hematology ROS (+)   Anesthesia Other Findings See surgeon's H&P  Can't see past tongue  Reproductive/Obstetrics negative OB ROS                          Anesthesia Physical Anesthesia Plan  ASA: III  Anesthesia Plan: General   Post-op Pain Management:    Induction: Intravenous  Airway Management Planned: Oral ETT  Additional Equipment:   Intra-op Plan:   Post-operative Plan: Extubation in OR  Informed Consent: I have reviewed the patients History and Physical, chart, labs and discussed the procedure including the risks, benefits and alternatives for the proposed anesthesia with the patient or authorized representative who has indicated his/her understanding and acceptance.   Dental Advisory Given  Plan Discussed with: CRNA and Surgeon  Anesthesia Plan Comments:         Anesthesia Quick Evaluation

## 2012-03-13 NOTE — H&P (Signed)
Subjective: Patient is a 67 y.o. female admitted for PLIF. Onset of symptoms was several years ago, gradually worsening since that time.  The pain is rated severe, and is located at the across the lower back and radiates to legs. The pain is described as aching and sharp and occurs all day. The symptoms have been progressive. Symptoms are exacerbated by exercise. MRI or CT showed DDD/ stenosis L3-4, L5-S1.   Past Medical History  Diagnosis Date  . Arthritis     back   . Back pain     arthritis  . History of colon polyps     Past Surgical History  Procedure Laterality Date  . Cesarean section       x 2  . Cholecystectomy    . Abdominal hysterectomy    . Back surgery      x 2  . Hammer toe surgery    . Colonoscopy    . Cataract extraction bilateral w/ anterior vitrectomy    . Radiology with anesthesia N/A 02/12/2012    Procedure: RADIOLOGY WITH ANESTHESIA;  Surgeon: Medication Radiologist, MD;  Location: [facility] OR;  Service: Radiology;  Laterality: N/A;  MRI    Prior to Admission medications   Medication Sig Start Date End Date Taking? Authorizing Provider  CALCIUM PO Take 1 capsule by mouth 2 (two) times daily.   Yes Historical Provider, MD  Multiple Vitamin (MULTIVITAMIN WITH MINERALS) TABS Take 1 tablet by mouth daily.   Yes Historical Provider, MD  vitamin E 400 UNIT capsule Take 800 Units by mouth daily.   Yes Historical Provider, MD   Allergies  Allergen Reactions  . Codeine Nausea And Vomiting  . Dilaudid (Hydromorphone Hcl) Nausea And Vomiting    History  Substance Use Topics  . Smoking status: Never Smoker   . Smokeless tobacco: Not on file  . Alcohol Use: No    History reviewed. No pertinent family history.   Review of Systems  Positive ROS: neg  All other systems have been reviewed and were otherwise negative with the exception of those mentioned in the HPI and as above.  Objective: Vital signs in last 24 hours: Temp:  [97.5 F (36.4 C)] 97.5 F (36.4 C)  (03/13 0621) Pulse Rate:  [78] 78 (03/13 0621) Resp:  [18] 18 (03/13 0621) BP: (139)/(46) 139/46 mmHg (03/13 0621) SpO2:  [96 %] 96 % (03/13 0621)  General Appearance: Alert, cooperative, no distress, appears stated age Head: Normocephalic, without obvious abnormality, atraumatic Eyes: PERRL, conjunctiva/corneas clear, EOM's intact, fundi benign, both eyes      Ears: Normal TM's and external ear canals, both ears Throat: Lips, mucosa, and tongue normal; teeth and gums normal Neck: Supple, symmetrical, trachea midline, no adenopathy; thyroid: No enlargement/tenderness/nodules; no carotid bruit or JVD Back: Symmetric, no curvature, ROM normal, no CVA tenderness Lungs: Clear to auscultation bilaterally, respirations unlabored Heart: Regular rate and rhythm, S1 and S2 normal, no murmur, rub or gallop Abdomen: Soft, non-tender, bowel sounds active all four quadrants, no masses, no organomegaly Extremities: Extremities normal, atraumatic, no cyanosis or edema Pulses: 2+ and symmetric all extremities Skin: Skin color, texture, turgor normal, no rashes or lesions  NEUROLOGIC:   Mental status: Alert and oriented x4,  no aphasia, good attention span, fund of knowledge, and memory Motor Exam - grossly normal Sensory Exam - grossly normal Reflexes: 1+ Coordination - grossly normal Gait - grossly normal Balance - grossly normal Cranial Nerves: I: smell Not tested  II: visual acuity  OS: nl  OD: nl  II: visual fields Full to confrontation  II: pupils Equal, round, reactive to light  III,VII: ptosis None  III,IV,VI: extraocular muscles  Full ROM  V: mastication Normal  V: facial light touch sensation  Normal  V,VII: corneal reflex  Present  VII: facial muscle function - upper  Normal  VII: facial muscle function - lower Normal  VIII: hearing Not tested  IX: soft palate elevation  Normal  IX,X: gag reflex Present  XI: trapezius strength  5/5  XI: sternocleidomastoid strength 5/5  XI:  neck flexion strength  5/5  XII: tongue strength  Normal    Data Review Lab Results  Component Value Date   WBC 7.4 03/06/2012   HGB 16.8* 03/06/2012   HCT 45.3 03/06/2012   MCV 89.0 03/06/2012   PLT 154 03/06/2012   Lab Results  Component Value Date   NA 139 03/06/2012   K 3.9 03/06/2012   CL 102 03/06/2012   CO2 24 03/06/2012   BUN 13 03/06/2012   CREATININE 0.95 03/06/2012   GLUCOSE 117* 03/06/2012   Lab Results  Component Value Date   INR 0.97 03/06/2012    Assessment/Plan: Patient admitted for PLIF. Patient has failed conservative therapy.  I explained the condition and procedure to the patient and answered any questions.  Patient wishes to proceed with procedure as planned. Understands risks/ benefits and typical outcomes of procedure.   JONES,Pieper Kasik S 03/13/2012 7:41 AM

## 2012-03-13 NOTE — Anesthesia Postprocedure Evaluation (Signed)
  Anesthesia Post-op Note  Patient: Evelyn Moore  Procedure(s) Performed: Procedure(s) with comments: MAXIMUM ACCESS (MAS) POSTERIOR LUMBAR INTERBODY FUSION (PLIF) 2 LEVEL (C-Arm) (N/A) - Maximum Access Posterior Lumbar Interbody Fusion Lumbar Three-Four, Lumbar Five-Sacral One  Patient Location: PACU  Anesthesia Type:General  Level of Consciousness: oriented and patient cooperative  Airway and Oxygen Therapy: Patient connected to nasal cannula oxygen  Post-op Pain: mild  Post-op Assessment: Post-op Vital signs reviewed, Patient's Cardiovascular Status Stable, Respiratory Function Stable, Patent Airway, No signs of Nausea or vomiting and Pain level controlled  Post-op Vital Signs: Reviewed and stable  Complications: No apparent anesthesia complications

## 2012-03-13 NOTE — Anesthesia Procedure Notes (Signed)
Procedure Name: Intubation Date/Time: 03/13/2012 7:57 AM Performed by: Marni Griffon Pre-anesthesia Checklist: Patient identified, Emergency Drugs available, Suction available and Patient being monitored Patient Re-evaluated:Patient Re-evaluated prior to inductionOxygen Delivery Method: Circle system utilized Preoxygenation: Pre-oxygenation with 100% oxygen Intubation Type: IV induction Ventilation: Two handed mask ventilation required Laryngoscope Size: Mac and 3 Grade View: Grade II Tube type: Oral Tube size: 7.5 mm Number of attempts: 1 Airway Equipment and Method: Stylet Placement Confirmation: ETT inserted through vocal cords under direct vision,  breath sounds checked- equal and bilateral and positive ETCO2 Secured at: 21 (cm at gum) cm Tube secured with: Tape Dental Injury: Teeth and Oropharynx as per pre-operative assessment

## 2012-03-13 NOTE — Op Note (Signed)
03/13/2012  1:17 PM  PATIENT:  Evelyn Moore  67 y.o. female  PRE-OPERATIVE DIAGNOSIS:  1. Scoliosis, lumbar, 2. 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 Degenerative disc disease L3-4 L5-S1, 4. 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 Decompressive lumbar laminectomy L3-4 and L5-S1 requiring more work than would be required of the typical PLIF procedure in order to adequately decompress the neural elements. The L3, L4, L5, and S1 nerve roots on either decompression. 2. Posterior lumbar interbody fusion L3-4 and L5-S1 using PEEK interbody cages packed with morcellized allograft and autograft  3. Posterior fixation L3-4 and L5-S1 using pedicle screws.    SURGEON:  Marikay Alar, MD  ASSISTANTS: Dr. Lovell Sheehan  ANESTHESIA:  General  EBL: 600 ml  Total I/O In: 2890 [I.V.:2050; Blood:340; IV Piggyback:500] Out: 750 [Urine:150; Blood:600]  BLOOD ADMINISTERED:200 CC PRBC  DRAINS: Hemovac   INDICATION FOR PROCEDURE: This patient had a previous decompression and fusion at L4-5 in the remote past. She had progressive pain in the back and legs. MRI showed progressive degenerative disc disease and stenosis at L3-4 L5-S1. Plain films suggest a flat back syndrome as well as lateral listhesis and scoliosis. Recommended a decompression and fusion at L3-4 and L5-S1. Patient understood the risks, benefits, and alternatives and potential outcomes and wished to proceed.  PROCEDURE DETAILS:  The patient was brought to the operating room. After induction of generalized endotracheal anesthesia the patient was rolled into the prone position on chest rolls and all pressure points were padded. The patient's lumbar region was cleaned and then prepped with DuraPrep and draped in the usual sterile fashion. Anesthesia was injected and then a dorsal midline incision was made and carried down to the lumbosacral fascia. The fascia was opened and the paraspinous musculature was taken down in  a subperiosteal fashion to expose L3-S1. Intraoperative fluoroscopy confirmed my level, and I started by placement of the L3 and L5 cortical pedicle screws. The pedicle screw entry zones were identified utilizing surface landmarks and AP and lateral fluoroscopy. I drilled in an upward and outward direction with the hand drill followed by tapping with the 5 mm utilizing EMG monitoring. 50 by 30 mm pedicle screws were placed at L3 and L5 bilaterally. We then checked our construct with AP and lateral fluoroscopy. I then turned my attention to the decompression L3-4 and L5-S1 and the spinous process was removed and complete lumbar laminectomies, hemi- facetectomies, and foraminotomies were performed at L3-4 and L5-S1. The yellow ligament was removed to expose the underlying dura and nerve roots, and generous foraminotomies were performed to adequately decompress the neural elements at both levels. I decompress the L3 and L4 nerve roots as well as the L5 and S1 nerve roots distally into their respective foramina. Once the decompression was complete, I turned my attention to the posterior lower lumbar interbody fusion. The epidural venous vasculature was coagulated and cut sharply. Disc space was incised and the initial discectomy was performed with pituitary rongeurs. The disc space was distracted with sequential distractors to a height of 8 mm. We then used a series of scrapers and shavers to prepare the endplates for fusion. The midline was prepared with Epstein curettes. Once the complete discectomy was finished, we packed an appropriate sized peek interbody cage with local autograft and morcellized allograft, gently retracted the nerve root, and tapped the cage into position at L3-4 and L5-S1 bilaterally.  The midline was packed with morselized autograft and allograft. We then turned  our attention to the posterior fixation at L4 and S1. The pedicle screw entry zones were identified utilizing surface landmarks and  fluoroscopy. We probed each pedicle with the pedicle probe and tapped each pedicle with the appropriate tap. We palpated with a ball probe to assure no break in the cortex. We then placed 50 by 30 mm pedicle screws into the L4 pedicles bilaterally and 6 5 x 35 mm pedicle screws into the pedicles bilaterally at S1. We then placed lordotic rods into the multiaxial screw heads of the pedicle screws and locked these in position with the locking caps and anti-torque device.  We then checked our construct with AP and lateral fluoroscopy. Irrigated with copious amounts of bacitracin-containing saline solution. Placed a medium Hemovac drain through separate stab incision. Inspected the nerve roots once again to assure adequate decompression, lined to the dura with Gelfoam, and closed the muscle and the fascia with 0 Vicryl. Closed the subcutaneous tissues with 2-0 Vicryl and subcuticular tissues with 3-0 Vicryl. The skin was closed with benzoin and Steri-Strips. Dressing was then applied, the patient was awakened from general anesthesia and transported to the recovery room in stable condition. At the end of the procedure all sponge, needle and instrument counts were correct.   PLAN OF CARE: Admit to inpatient   PATIENT DISPOSITION:  PACU - hemodynamically stable.   Delay start of Pharmacological VTE agent (>24hrs) due to surgical blood loss or risk of bleeding:  yes

## 2012-03-14 LAB — CBC
HCT: 35.5 % — ABNORMAL LOW (ref 36.0–46.0)
Hemoglobin: 13 g/dL (ref 12.0–15.0)
MCH: 32.8 pg (ref 26.0–34.0)
MCHC: 36.6 g/dL — ABNORMAL HIGH (ref 30.0–36.0)
MCV: 89.6 fL (ref 78.0–100.0)

## 2012-03-14 NOTE — Progress Notes (Signed)
Inpatient Diabetes Program Recommendations  AACE/ADA: New Consensus Statement on Inpatient Glycemic Control (2013)  Target Ranges:  Prepandial:   less than 140 mg/dL      Peak postprandial:   less than 180 mg/dL (1-2 hours)      Critically ill patients:  140 - 180 mg/dL   Reason for Visit: Lab glucose of 183 mg/dl.   Inpatient Diabetes Program Recommendations HgbA1C: Elevated lab glucose of 183 mg/dl -- non-fasting.  Please consider ordering a Hgb A1C.  No known history of diabetes.  Note:  Patient receiving Decadron 4mg  qid which can contribute to elevated glucose.  Please consider ordering CBG's ac and HS while in the hospital in addition to Hgb A1C.  Thank you.  Patti S. Elsie Lincoln, RN, CNS, CDE Inpatient Diabetes Program, team pager [phone]

## 2012-03-14 NOTE — Progress Notes (Signed)
UR COMPLETED  

## 2012-03-14 NOTE — Discharge Summary (Signed)
Physician Discharge Summary  Patient ID: Evelyn Moore MRN: [id number] DOB/AGE: [birth year] 67 y.o.  Admit date: 03/13/2012 Discharge date: 03/14/2012  Admission Diagnoses: adjacent level stenosis    Discharge Diagnoses: same   Discharged Condition: good  Hospital Course: The patient was admitted on 03/13/2012 and taken to the operating room where the patient underwent PLIF L3-4, L5-s1. The patient tolerated the procedure well and was taken to the recovery room and then to the floor in stable condition. The hospital course was routine. There were no complications. The wound remained clean dry and intact. Pt had appropriate back soreness. No complaints of leg pain or new N/T/W. The patient remained afebrile with stable vital signs, and tolerated a regular diet. The patient continued to increase activities, and pain was well controlled with oral pain medications.   Consults: None  Significant Diagnostic Studies:  Results for orders placed during the hospital encounter of 03/13/12  CBC      Result Value Range   WBC 10.2  4.0 - 10.5 K/uL   RBC 3.96  3.87 - 5.11 MIL/uL   Hemoglobin 13.0  12.0 - 15.0 g/dL   HCT 14.7 (*) 82.9 - 56.2 %   MCV 89.6  78.0 - 100.0 fL   MCH 32.8  26.0 - 34.0 pg   MCHC 36.6 (*) 30.0 - 36.0 g/dL   RDW 13.0  86.5 - 78.4 %   Platelets 140 (*) 150 - 400 K/uL  POCT I-STAT 4, (NA,K, GLUC, HGB,HCT)      Result Value Range   Sodium 136  135 - 145 mEq/L   Potassium 3.9  3.5 - 5.1 mEq/L   Glucose, Bld 183 (*) 70 - 99 mg/dL   HCT 69.6 (*) 29.5 - 28.4 %   Hemoglobin 10.9 (*) 12.0 - 15.0 g/dL    Chest 2 View  01/03/2438  *RADIOLOGY REPORT*  Clinical Data: Preop.  CHEST - 2 VIEW  Comparison: None.  Findings: Trachea is midline.  Heart size normal.  Lungs are clear. No pleural fluid.  IMPRESSION: No acute findings.   Original Report Authenticated By: Leanna Battles, M.D.    Dg Lumbar Spine Complete  03/13/2012  *RADIOLOGY REPORT*  Clinical Data: L3-4 and L5-S1 fusion.   DG C-ARM GT 120 MIN, LUMBAR SPINE - COMPLETE 4+ VIEW  Comparison: MRI lumbar spine 02/12/2012.  Findings: We are provided with two fluoroscopic intraoperative spot views of the lumbar spine.  Images demonstrate new pedicle screws and stabilization bars at L3-4 and L5-S1.  Prior L4-5 fusion noted.  IMPRESSION: L3-4 and L5-S1 fusion.   Original Report Authenticated By: Holley Dexter, M.D.    Dg C-arm Gt 120 Min  03/13/2012  *RADIOLOGY REPORT*  Clinical Data: L3-4 and L5-S1 fusion.  DG C-ARM GT 120 MIN, LUMBAR SPINE - COMPLETE 4+ VIEW  Comparison: MRI lumbar spine 02/12/2012.  Findings: We are provided with two fluoroscopic intraoperative spot views of the lumbar spine.  Images demonstrate new pedicle screws and stabilization bars at L3-4 and L5-S1.  Prior L4-5 fusion noted.  IMPRESSION: L3-4 and L5-S1 fusion.   Original Report Authenticated By: Holley Dexter, M.D.     Antibiotics:  Anti-infectives   Start     Dose/Rate Route Frequency Ordered Stop   03/13/12 2200  ceFAZolin (ANCEF) IVPB 1 g/50 mL premix     1 g 100 mL/hr over 30 Minutes Intravenous Every 8 hours 03/13/12 1949 03/14/12 0538   03/13/12 1158  ceFAZolin (ANCEF) 2-3 GM-% IVPB SOLR    Comments:  Reece Packer: cabinet override      03/13/12 1158 03/13/12 1200   03/13/12 0848  bacitracin 50,000 Units in sodium chloride irrigation 0.9 % 500 mL irrigation  Status:  Discontinued       As needed 03/13/12 0848 03/13/12 1331   03/13/12 0710  bacitracin 16109 UNITS injection    Comments:  Reece Packer: cabinet override      03/13/12 0710 03/13/12 1914   03/13/12 0600  ceFAZolin (ANCEF) IVPB 2 g/50 mL premix     2 g 100 mL/hr over 30 Minutes Intravenous On call to O.R. 03/12/12 1422 03/13/12 0750      Discharge Exam: Blood pressure 128/43, pulse 80, temperature 98.2 F (36.8 C), temperature source Oral, resp. rate 17, height 5\' 2"  (1.575 m), weight 101.197 kg (223 lb 1.6 oz), SpO2 98.00%. Neurologic: Grossly normal Incision  CDI  Discharge Medications:     Medication List    TAKE these medications       CALCIUM PO  Take 1 capsule by mouth 2 (two) times daily.     multivitamin with minerals Tabs  Take 1 tablet by mouth daily.     vitamin E 400 UNIT capsule  Take 800 Units by mouth daily.        Disposition: home   Final Dx: PLIF L3-4, L5-S1      Discharge Orders   Future Orders Complete By Expires     Call MD for:  difficulty breathing, headache or visual disturbances  As directed     Call MD for:  persistant nausea and vomiting  As directed     Call MD for:  redness, tenderness, or signs of infection (pain, swelling, redness, odor or green/yellow discharge around incision site)  As directed     Call MD for:  severe uncontrolled pain  As directed     Call MD for:  temperature >100.4  As directed     Diet - low sodium heart healthy  As directed     Discharge instructions  As directed     Comments:      No bending, twisting, or heavy lifting. Take it easy. May shower. No driving.    Increase activity slowly  As directed        Follow-up Information   Follow up with Ehren Berisha,DAVID S, MD. Schedule an appointment as soon as possible for a visit in 2 weeks.   Contact information:   [address] [place] [address] [phone]        Signed: Tia Alert 03/14/2012, 12:59 PM

## 2012-03-14 NOTE — Evaluation (Signed)
Physical Therapy Evaluation Patient Details Name: Evelyn Moore MRN: [id number] DOB: 20-Jan-1945 Today's Date: 03/14/2012 Time: 2956-2130 PT Time Calculation (min): 30 min  PT Assessment / Plan / Recommendation Clinical Impression  Pt. is s/p 2 level PLIF with h/o 2 previous back surgeries.  She presents to PT with her pain well managed (reports 2/10 level with no pain med since last pm).  She also has a decrease in her usual functional activity and gait level and will benefit from PT to address these and below issues.      PT Assessment  Patient needs continued PT services    Follow Up Recommendations  No PT follow up    Does the patient have the potential to tolerate intense rehabilitation      Barriers to Discharge None      Equipment Recommendations  Rolling walker with 5" wheels    Recommendations for Other Services     Frequency Min 5X/week    Precautions / Restrictions Precautions Precautions: Back Precaution Booklet Issued: Yes (comment) (pt. deucated on back precautions and log rolling techcnique) Precaution Comments: this is pt's 3rd back surgery, by her report Required Braces or Orthoses: Spinal Brace Spinal Brace: Lumbar corset;Applied in sitting position Restrictions Weight Bearing Restrictions: No   Pertinent Vitals/Pain 2/10 in low back; no med requested but pt. Positioned for comfort after gait training      Mobility  Bed Mobility Bed Mobility: Not assessed (pt. already up in chair) Transfers Transfers: Sit to Stand;Stand to Sit Sit to Stand: 4: Min guard;From chair/3-in-1;With armrests Stand to Sit: 4: Min guard;To chair/3-in-1;With armrests Details for Transfer Assistance: cues for safe technique and hand placement Ambulation/Gait Ambulation/Gait Assistance: 4: Min guard Ambulation Distance (Feet): 100 Feet Assistive device: Rolling walker Ambulation/Gait Assistance Details: cues to stand as erect as possible; tends to flex trunk and lean toward  right as was her pre-op gait pattern by her report Gait Pattern: Step-through pattern;Trunk flexed Gait velocity: slowed Stairs: No    Exercises     PT Diagnosis: Difficulty walking;Acute pain;Abnormality of gait  PT Problem List: Decreased activity tolerance;Decreased mobility;Decreased knowledge of use of DME;Decreased knowledge of precautions;Pain PT Treatment Interventions: DME instruction;Gait training;Stair training;Functional mobility training;Patient/family education;Therapeutic activities   PT Goals Acute Rehab PT Goals PT Goal Formulation: With patient Time For Goal Achievement: 03/21/12 Potential to Achieve Goals: Good Pt will Roll Supine to Right Side: with modified independence PT Goal: Rolling Supine to Right Side - Progress: Goal set today Pt will Roll Supine to Left Side: with modified independence PT Goal: Rolling Supine to Left Side - Progress: Goal set today Pt will go Supine/Side to Sit: with modified independence PT Goal: Supine/Side to Sit - Progress: Goal set today Pt will go Sit to Stand: with modified independence PT Goal: Sit to Stand - Progress: Goal set today Pt will go Stand to Sit: with modified independence PT Goal: Stand to Sit - Progress: Goal set today Pt will Ambulate: >150 feet;with modified independence;with least restrictive assistive device PT Goal: Ambulate - Progress: Goal set today Pt will Go Up / Down Stairs: 1-2 stairs;with min assist;with least restrictive assistive device PT Goal: Up/Down Stairs - Progress: Goal set today Additional Goals Additional Goal #1: Pt. will state and comply with 3/3 back precautions and log rolling technique PT Goal: Additional Goal #1 - Progress: Goal set today  Visit Information  Last PT Received On: 03/14/12 Assistance Needed: +1    Subjective Data  Subjective: "This makes the thrid  dtime I have had back surgery" Patient Stated Goal: Housecleaning, cooking, riding with husband on his calls"   Prior  Functioning  Home Living Lives With: Spouse Available Help at Discharge: Family;Available 24 hours/day Type of Home: House Home Access: Ramped entrance;Other (comment) (1 step to enter from ramp) Home Layout: Two level;Bed/bath upstairs;Able to live on main level with bedroom/bathroom Bathroom Shower/Tub: Walk-in shower;Curtain;Other (comment) (not currently working) Firefighter: Pharmacist, community: Yes How Accessible: Accessible via walker Home Adaptive Equipment: Bedside commode/3-in-1;Shower chair with back;Walker - standard;Quad cane Prior Function Level of Independence: Independent with assistive device(s) Able to Take Stairs?: No Driving: No Vocation: Retired Musician: No difficulties Dominant Hand: Right    Cognition  Cognition Overall Cognitive Status: Appears within functional limits for tasks assessed/performed Arousal/Alertness: Awake/alert Orientation Level: Oriented X4 / Intact Behavior During Session: [facility] for tasks performed    Extremity/Trunk Assessment Right Upper Extremity Assessment RUE ROM/Strength/Tone: [facility] for tasks assessed Left Upper Extremity Assessment LUE ROM/Strength/Tone: WFL for tasks assessed Right Lower Extremity Assessment RLE ROM/Strength/Tone: WFL for tasks assessed RLE Sensation: WFL - Light Touch RLE Coordination: WFL - gross motor Left Lower Extremity Assessment LLE ROM/Strength/Tone: WFL for tasks assessed LLE Sensation: WFL - Light Touch LLE Coordination: WFL - gross motor Trunk Assessment Trunk Assessment: Normal   Balance    End of Session PT - End of Session Equipment Utilized During Treatment: Gait belt;Back brace Activity Tolerance: Patient tolerated treatment well Patient left: in chair;with call bell/phone within reach;Other (comment) (educated pt on limiting her sitting time to 1 hour at a time) Nurse Communication: Mobility status  GP     Ferman Hamming 03/14/2012, 8:49  AM Weldon Picking PT Acute Rehab Services [phone] [place] [phone]

## 2012-03-14 NOTE — Progress Notes (Signed)
ANS and discharge instruction given to pt  , saline lock removed   Reminded of fallow up appointment with Dr Yetta Barre in 2 weeks , Back precaution  and incision care emphasized Pt demonstrated  good understanding. Roll]ing walker delivered. Pt discharged home . Condition at discharge is stable. Azzie Roup RN

## 2012-03-17 MED FILL — Heparin Sodium (Porcine) Inj 1000 Unit/ML: INTRAMUSCULAR | Qty: 30 | Status: AC

## 2012-03-17 MED FILL — Sodium Chloride IV Soln 0.9%: INTRAVENOUS | Qty: 2000 | Status: AC

## 2012-04-29 ENCOUNTER — Other Ambulatory Visit: Payer: Self-pay | Admitting: Neurological Surgery

## 2012-04-29 DIAGNOSIS — M549 Dorsalgia, unspecified: Secondary | ICD-10-CM

## 2012-05-06 ENCOUNTER — Ambulatory Visit
Admission: RE | Admit: 2012-05-06 | Discharge: 2012-05-06 | Disposition: A | Discharge status: Home / Self Care | Payer: Medicare Other | Source: Ambulatory Visit | Attending: Neurological Surgery | Admitting: Neurological Surgery

## 2012-05-06 VITALS — BP 123/53 | HR 63

## 2012-05-06 DIAGNOSIS — M549 Dorsalgia, unspecified: Secondary | ICD-10-CM

## 2012-05-06 MED ORDER — DIAZEPAM 5 MG PO TABS
5.0000 mg | ORAL_TABLET | Freq: Once | ORAL | Status: AC
  Administered 2012-05-06: 5 mg via ORAL

## 2012-05-06 MED ORDER — IOHEXOL 180 MG/ML  SOLN
15.0000 mL | Freq: Once | INTRAMUSCULAR | Status: AC | PRN
  Administered 2012-05-06: 15 mL via INTRATHECAL

## 2014-04-28 IMAGING — CR DG CHEST 2V
2 series · 2 of 2 positions shown · non-contrast
Comparison: None.

CLINICAL DATA: Preop.

CHEST - 2 VIEW

[view not recorded (1 of 2)]
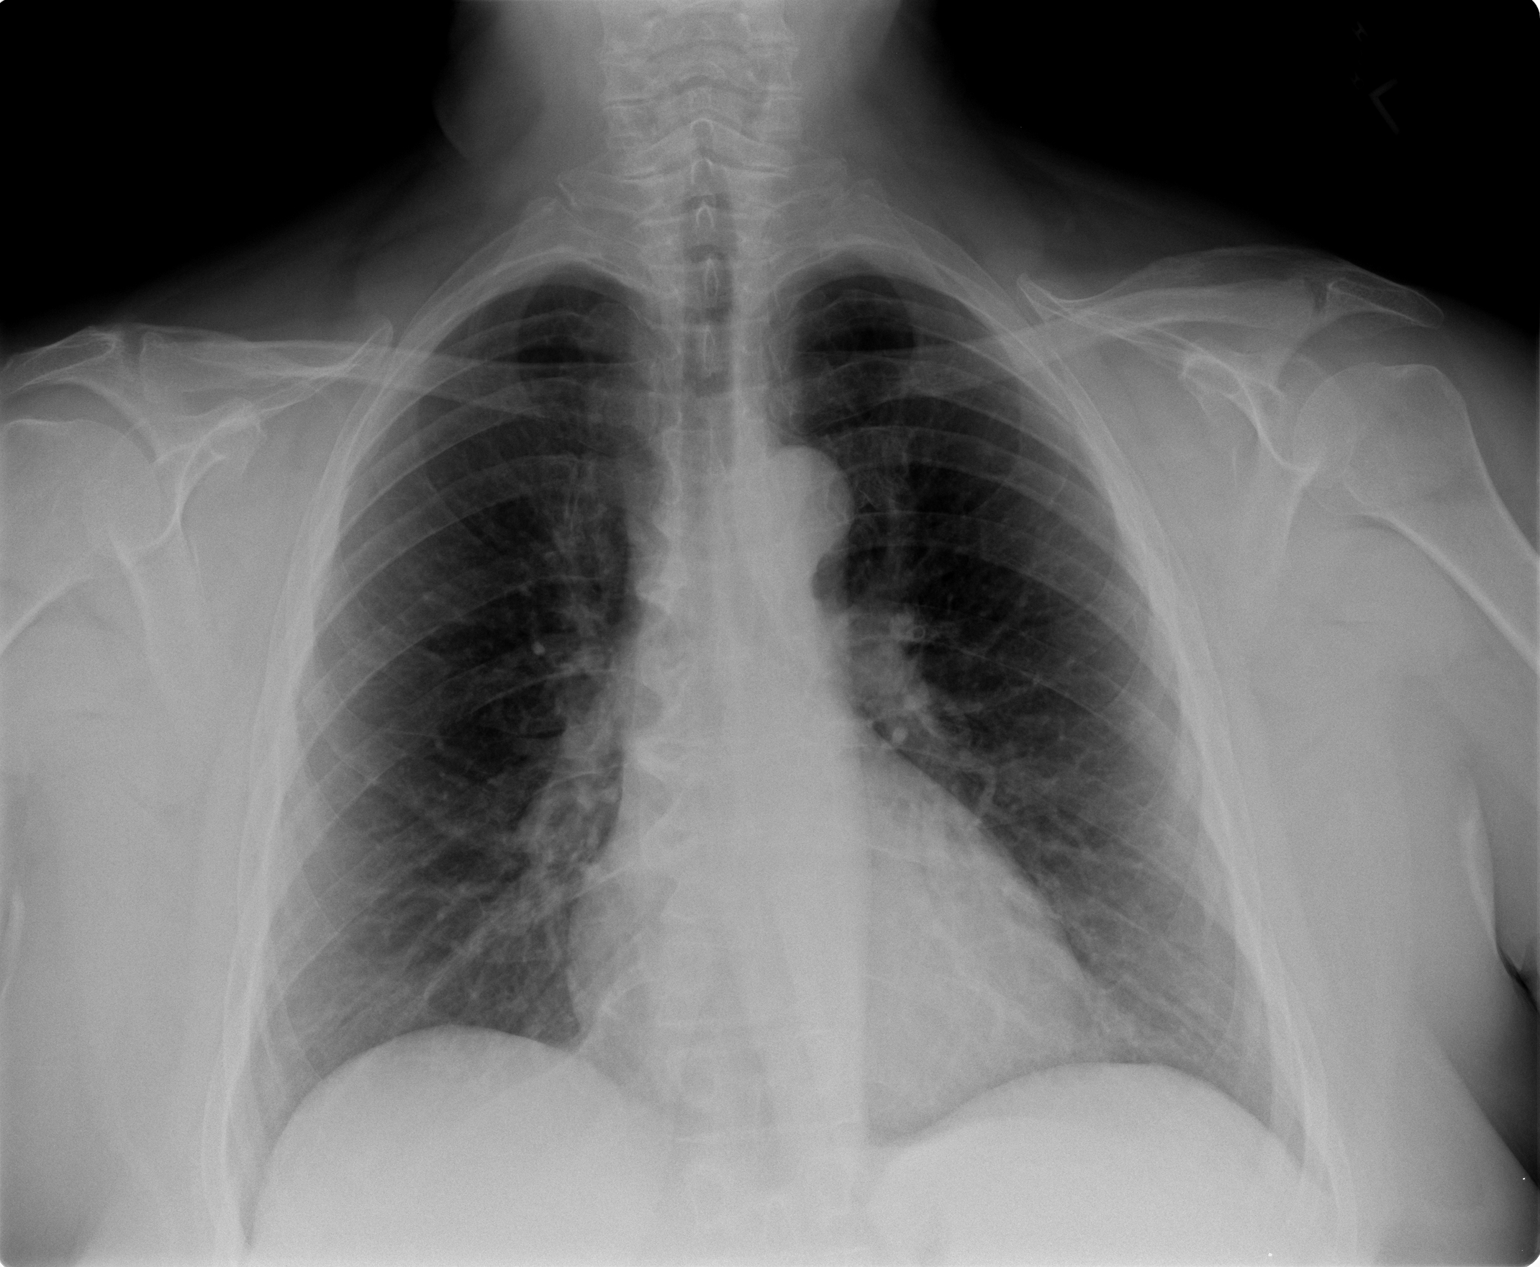

[view not recorded (2 of 2)]
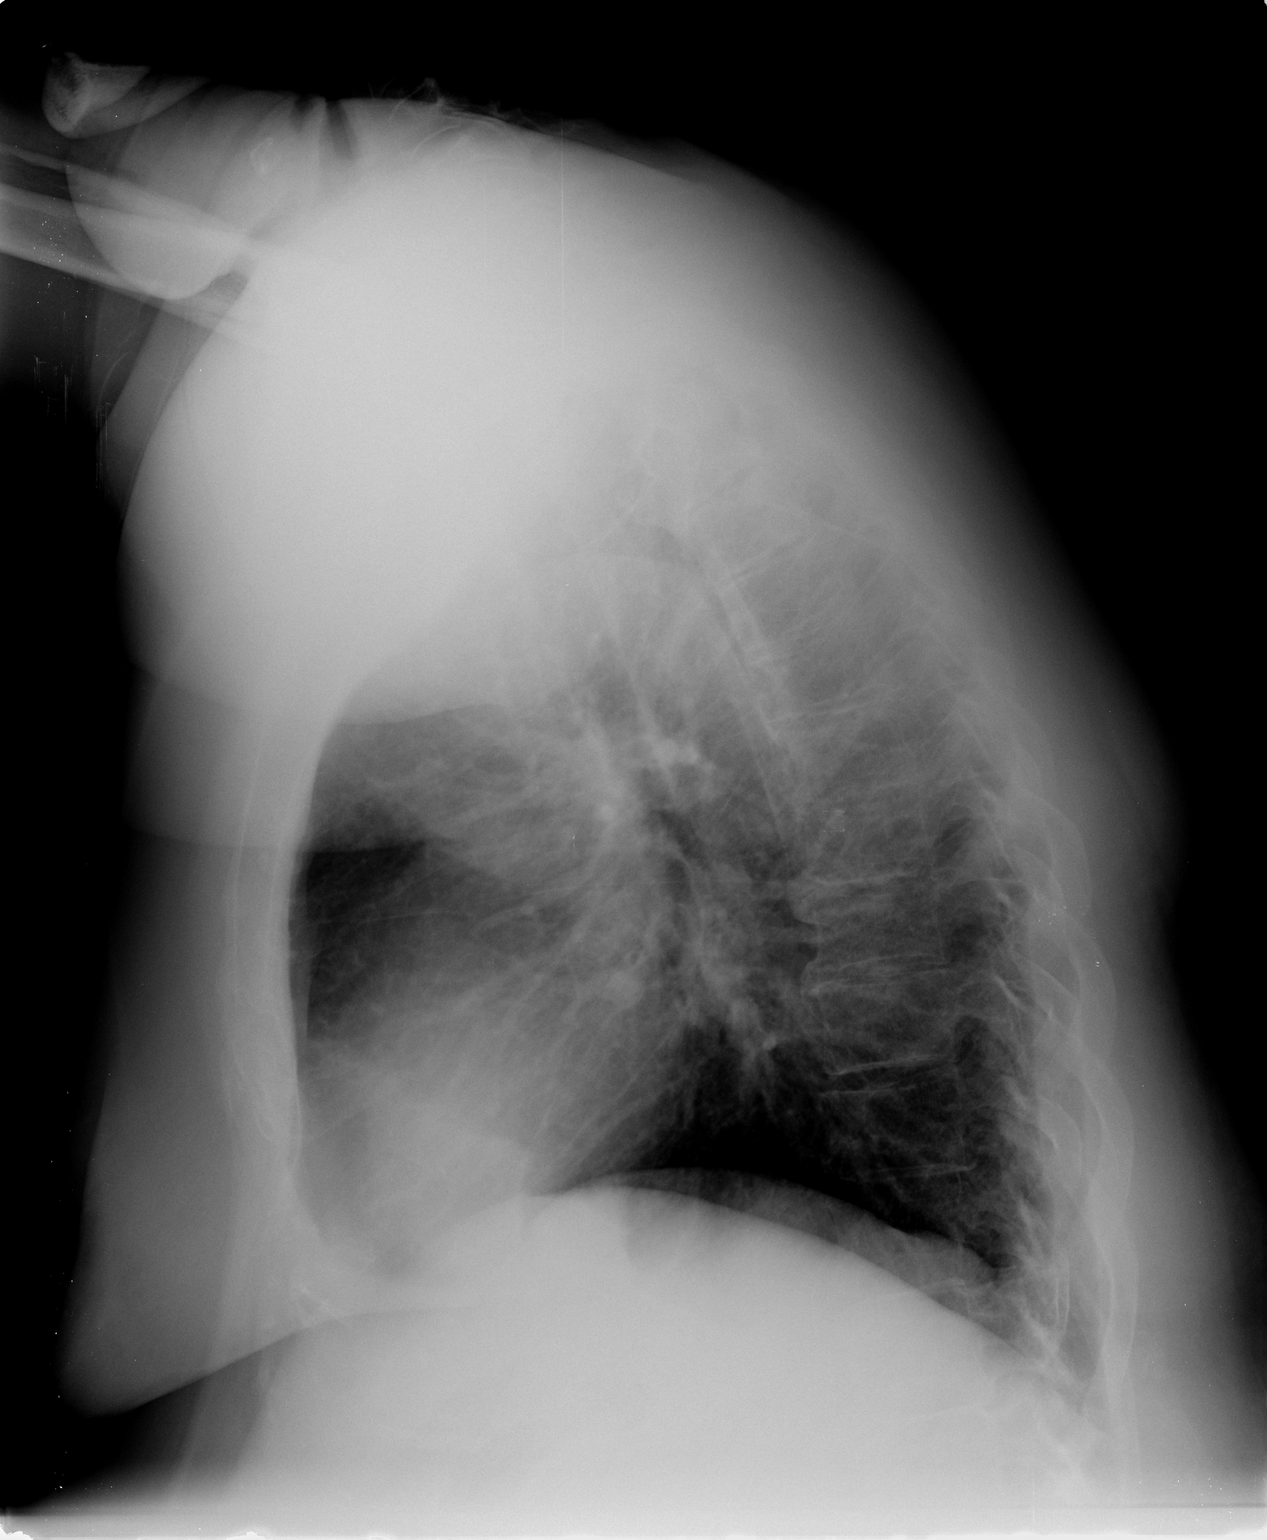

[2 of 2 positions shown; findings below may reference images not displayed]

FINDINGS: Trachea is midline.  Heart size normal.  Lungs are clear.
No pleural fluid.
IMPRESSION: No acute findings.

## 2014-05-05 IMAGING — RF DG LUMBAR SPINE COMPLETE 4+V
1 series · 4 of 4 positions shown · non-contrast
Comparison: MRI lumbar spine 02/12/2012.

CLINICAL DATA: L3-4 and L5-S1 fusion.

DG C-ARM GT 120 MIN, LUMBAR SPINE - COMPLETE 4+ VIEW

[Series 1: run · 4 of 4 slices shown]
[im 1/4]
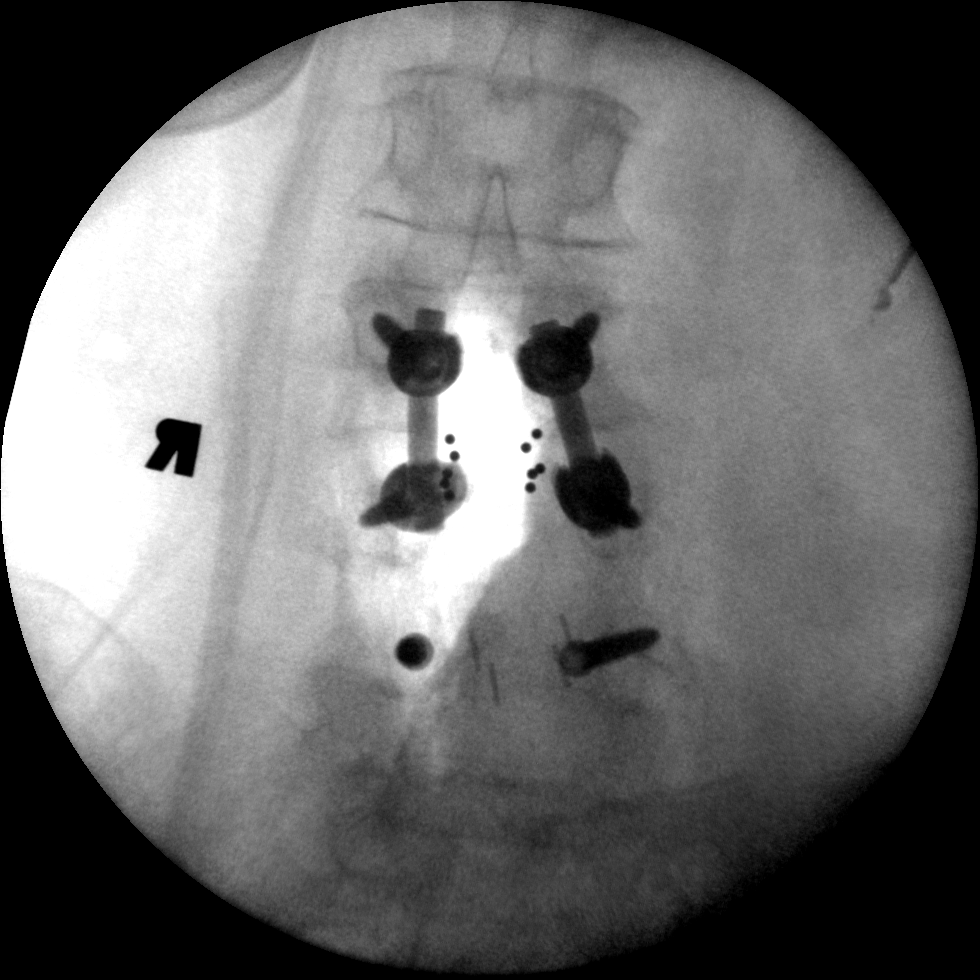
[im 2/4]
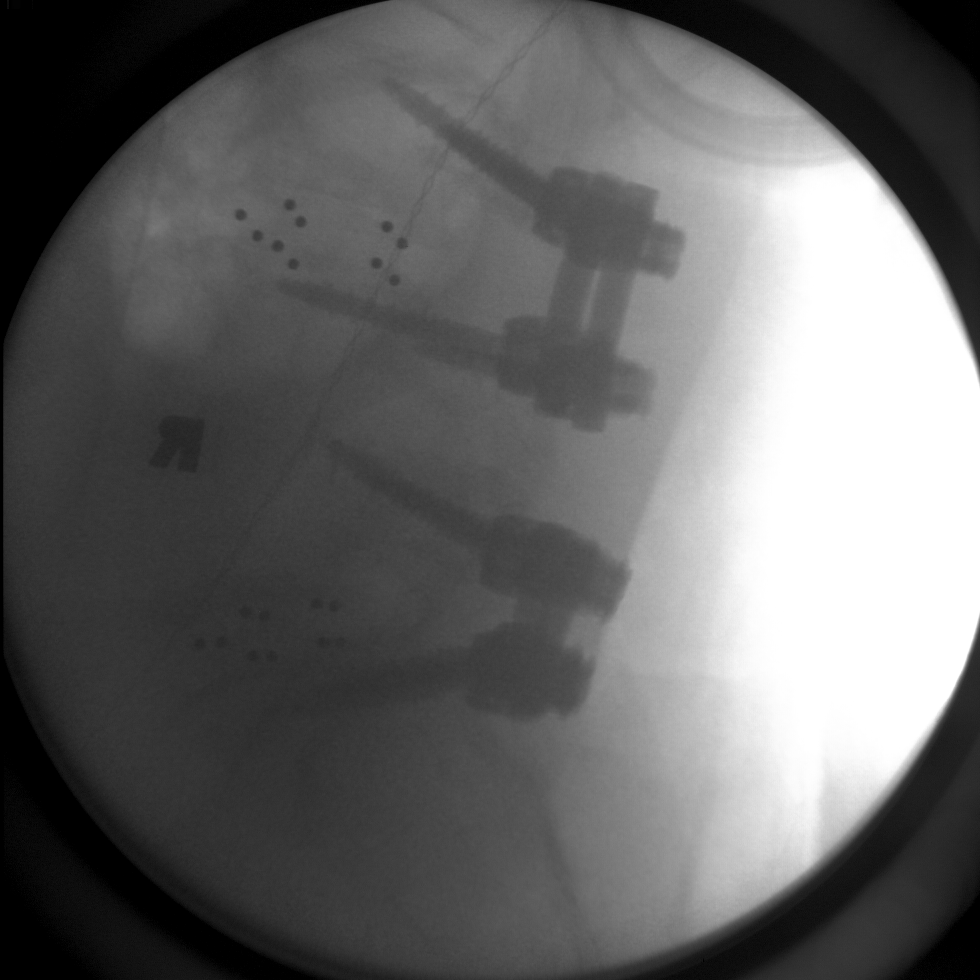
[im 3/4]
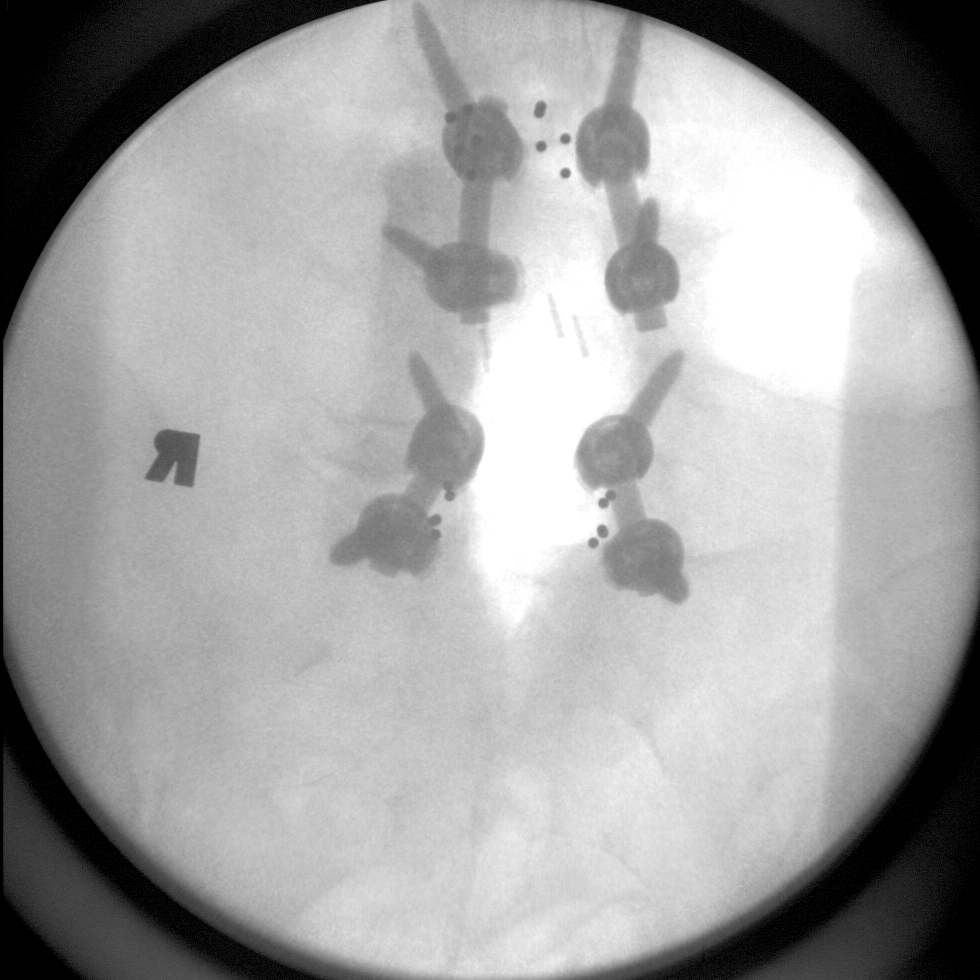
[im 4/4]
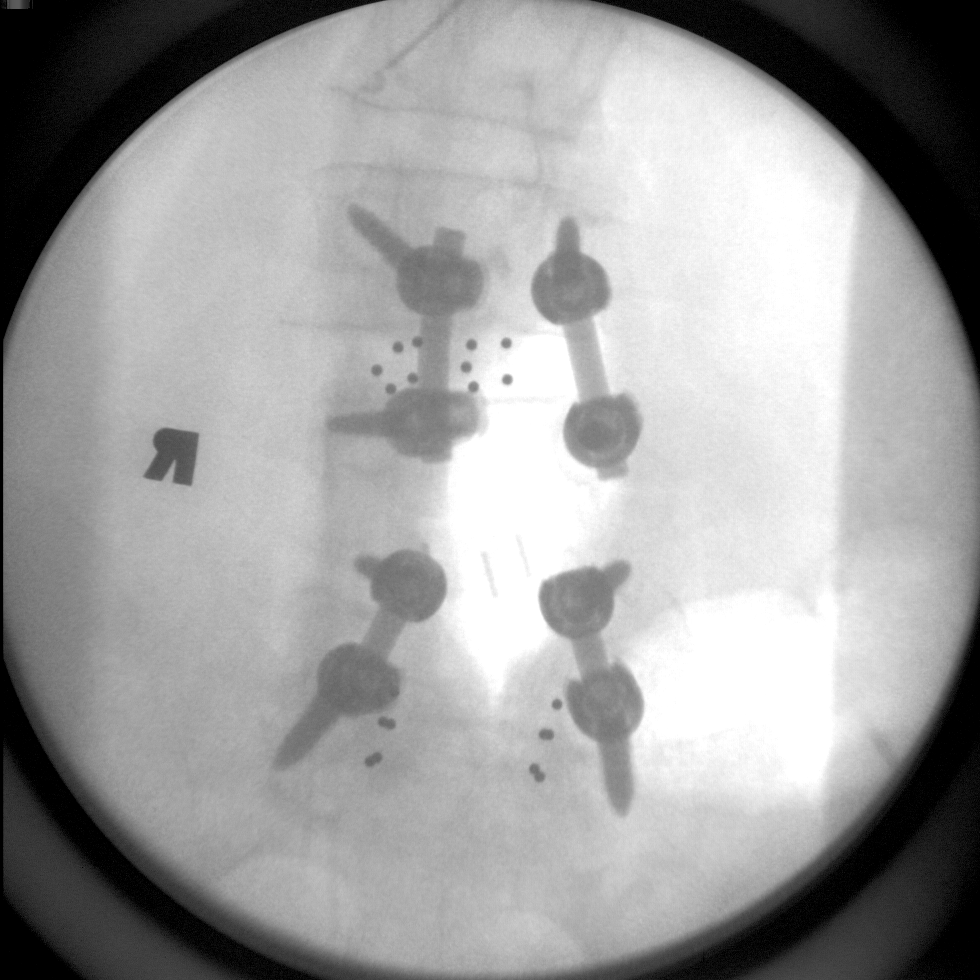

[4 of 4 positions shown; findings below may reference images not displayed]

FINDINGS: We are provided with two fluoroscopic intraoperative spot
views of the lumbar spine.  Images demonstrate new pedicle screws
and stabilization bars at L3-4 and L5-S1.  Prior L4-5 fusion noted.
IMPRESSION: L3-4 and L5-S1 fusion.

## 2014-06-28 IMAGING — CR DG MYELOGRAM LUMBAR
13 of 17 series · 13 of 17 positions shown · IV contrast (omnipaque)
Comparison: Lumbar spine radiographs 03/31/2012.  MRI of the
lumbar spine 02/12/2012.

CLINICAL DATA: Recurrent right hip pain extending into the right
groin and across the low back.  Status post lumbar spine surgery.

MYELOGRAM INJECTION
TECHNIQUE: Informed consent was obtained from the patient prior to
the procedure, including potential complications of headache,
allergy, infection and pain.  A timeout procedure was performed.
With the patient prone, the lower back was prepped with Betadine.
1% Lidocaine was used for local anesthesia.  Lumbar puncture was
performed at the left paramidline L1-2 level using a 22 gauge
needle with return of clear CSF.  15 ml of Omnipaque 832was
injected into the subarachnoid space .
TECHNIQUE: I personally performed the lumbar puncture and
administered the intrathecal contrast. I also personally supervised
acquisition of the myelogram images. Following injection of
intrathecal Omnipaque contrast, spine imaging in multiple
projections was performed using fluoroscopy.
Fluoroscopy Time: 51 seconds
TECHNIQUE: CT imaging of the lumbar spine was performed after
intrathecal contrast administration.  Multiplanar CT image
reconstructions were also generated.

[[hospital] (1 of 2)]
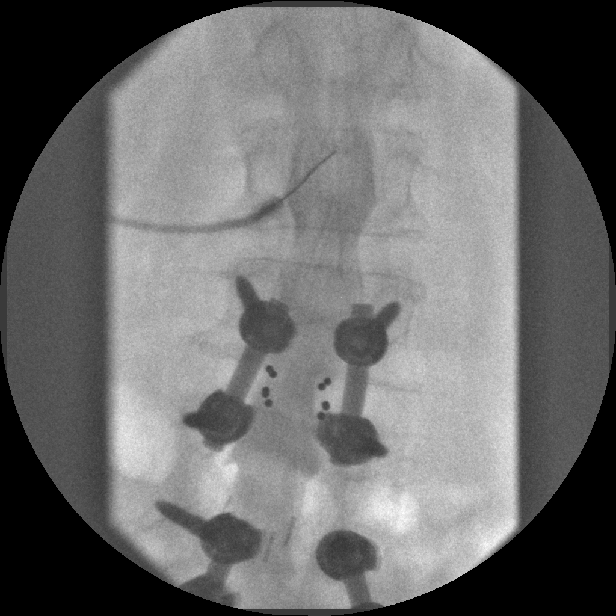

[[hospital] (2 of 2)]
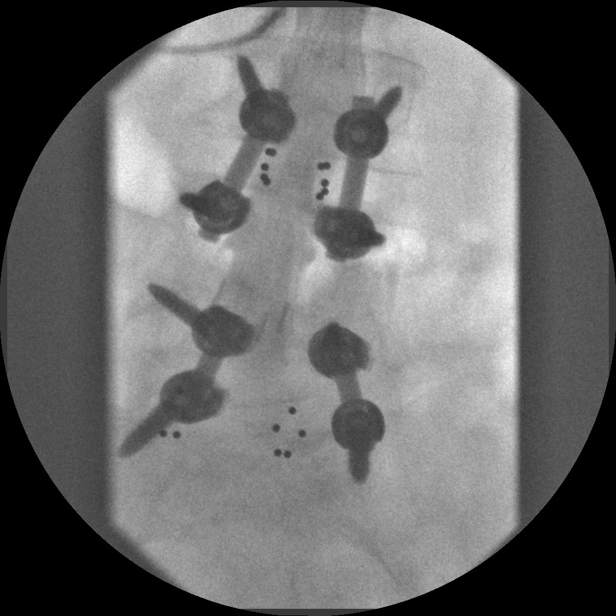

[myelogram  white (1 of 9)]
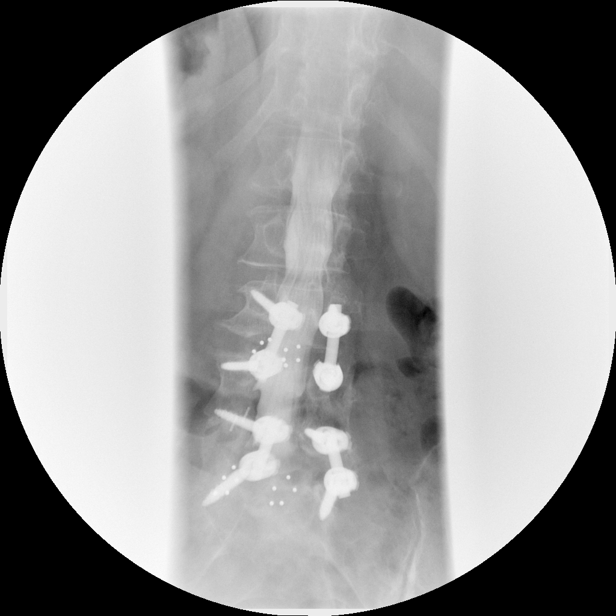

[myelogram  white (2 of 9)]
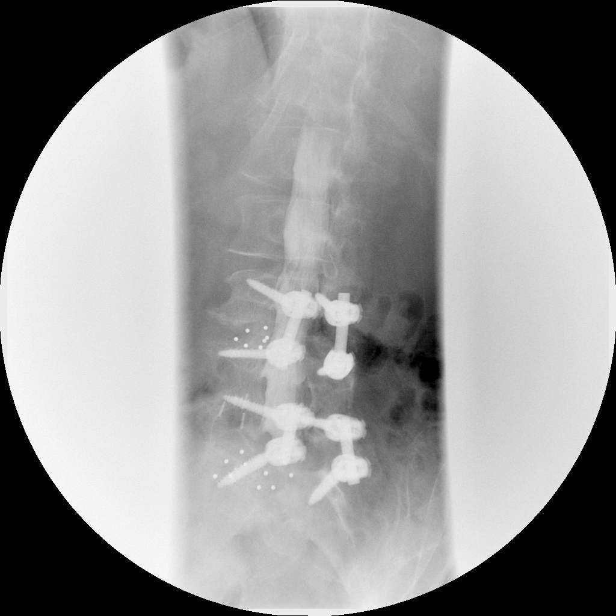

[myelogram  white (3 of 9)]
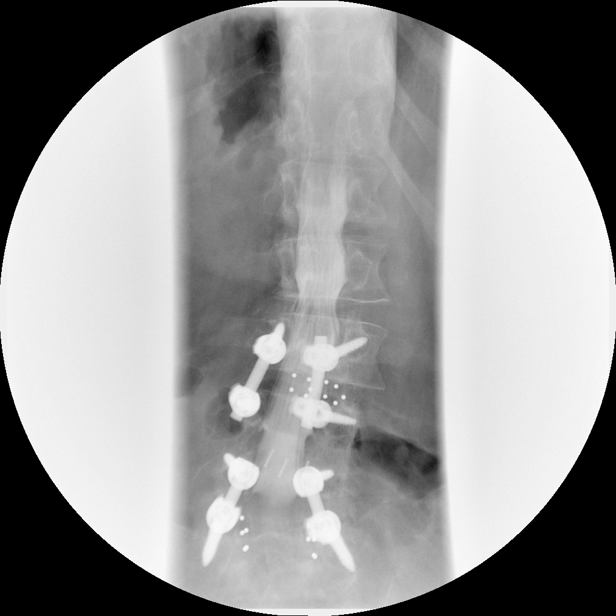

[myelogram  white (4 of 9)]
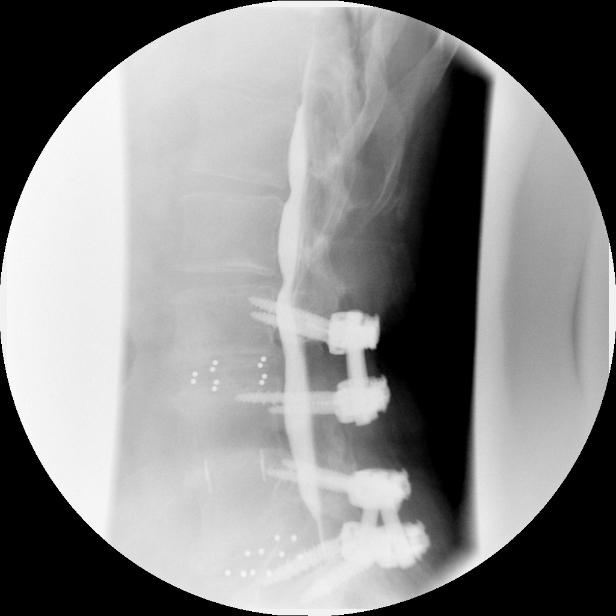

[myelogram  white (5 of 9)]
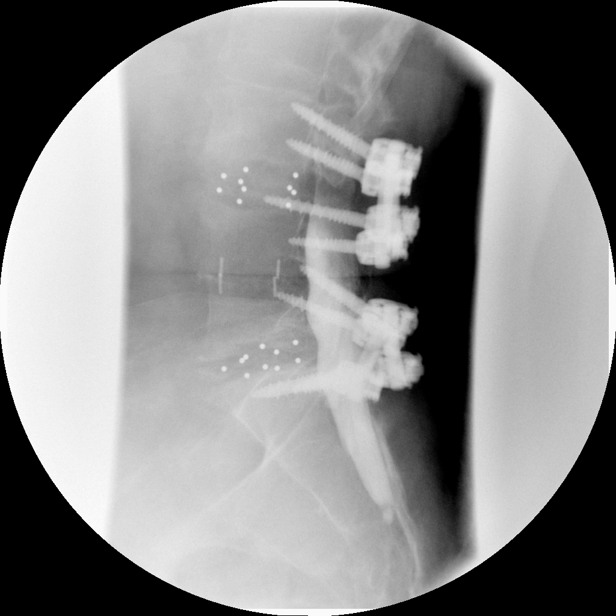

[myelogram  white (6 of 9)]
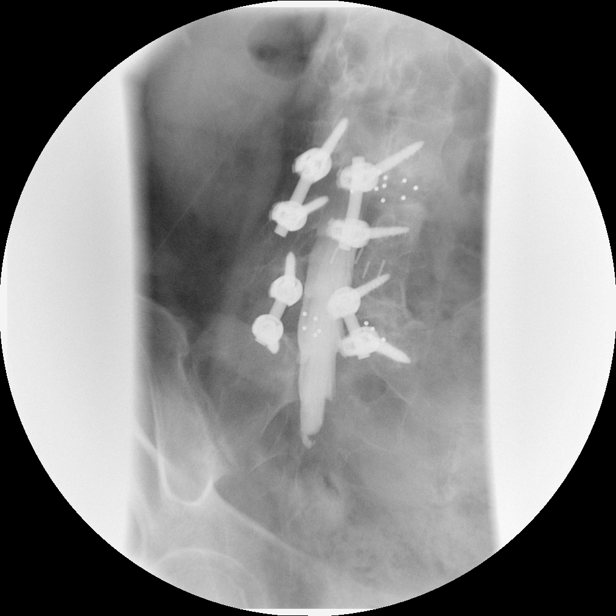

[myelogram  white (7 of 9)]
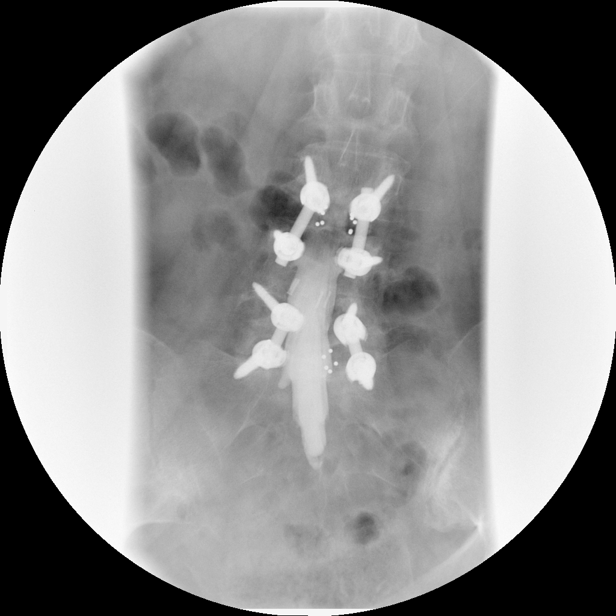

[myelogram  white (8 of 9)]
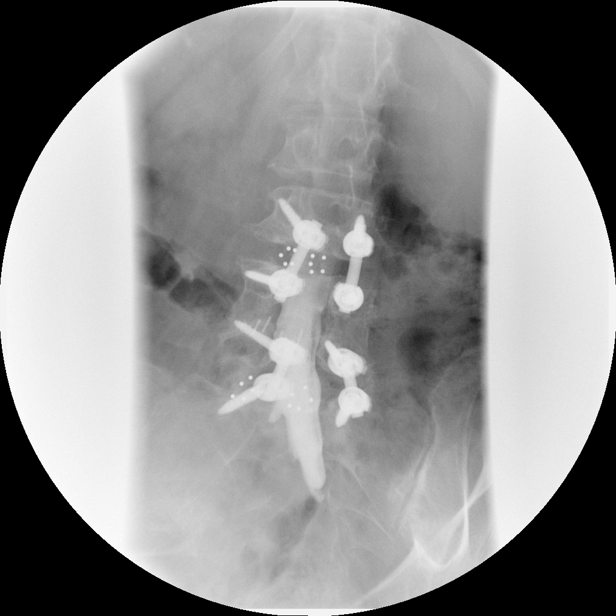

[myelogram  white (9 of 9)]
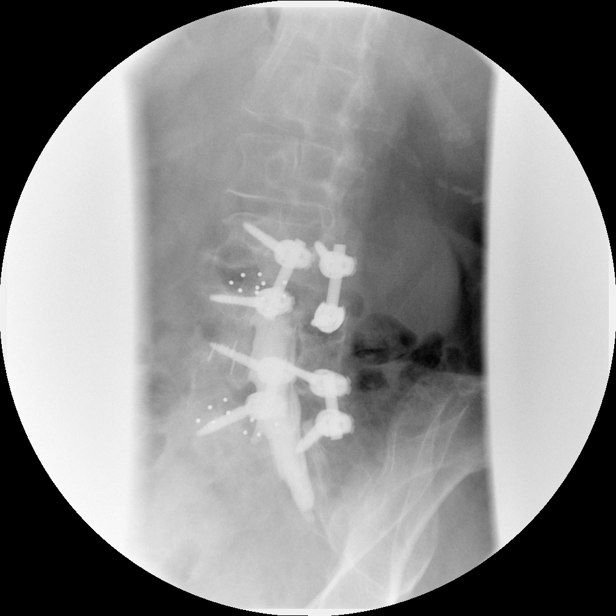

[view not recorded (1 of 2)]
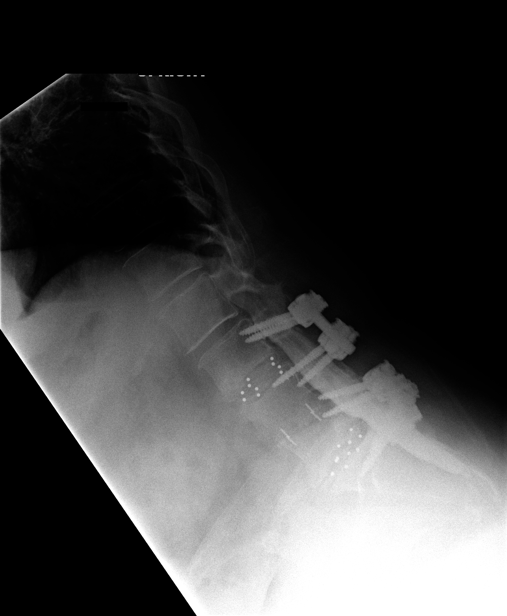

[view not recorded (2 of 2)]
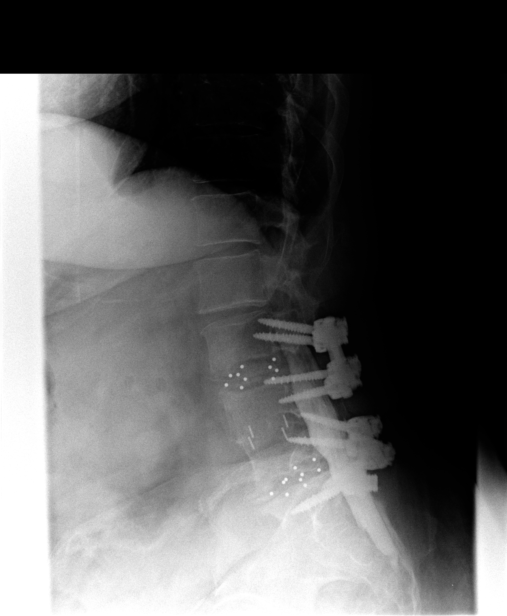

[13 of 17 positions shown; findings below may reference images not displayed]

IMPRESSION: Successful injection of  intrathecal contrast for myelography.

MYELOGRAM LUMBAR
FINDINGS: Remote fusion at L4-5 is solid.  The patient is now
status post PLIF at L3-4 and L5-S1.  The lumbar nerve roots fill
normally on both sides.  Hardware is intact.

There is no abnormal motion during flexion extension of the fused
segments.  There is some anterolisthesis with flexion at L2-3,
exposing the disc.  Alignment is anatomic in extension.
IMPRESSION: 1.  Status post extension of fusion to include L3-4 and L5-S1.
2.  No definite stenosis within the fused segments.
3.  Dynamic anterolisthesis at the adjacent level, L2-3 with
exposure of the disc.


CT MYELOGRAPHY LUMBAR SPINE
FINDINGS: The conus medullaris terminates at L1, within normal limits.
Fusion at L4-5 is mature. The patient is status post L5-S1 and L3-4
PLIF.  The left pedicle screw at L5 extends beyond the cortex and
appears to rest just below the exiting left L4 nerve root.  The
right-sided screws are contained within the bone.  Graft material
at L3-4 and L5-S1 is well positioned.  Limited imaging of the
abdomen is unremarkable.  Moderate degenerative changes are present
at the SI joints bilaterally.

T12-L1:  Minimal facet hypertrophy is present.  There is no
significant stenosis.

L1-2:  Mild facet hypertrophy is present.  There is slight disc
bulging.  No significant stenosis is evident.

L2-3:  A broad-based disc herniation is asymmetric to the left.
Mild foraminal narrowing is similar to the prior study, worse on
the left.

L3-4:  The patient is status post PLIF.  No residual disc
herniation or stenosis is evident.

L4-5:  Fusion is solid.  No residual or recurrent stenosis is
evident.

L5-S1:  The patient is status post PLIF.  No residual or recurrent
stenosis is evident.
IMPRESSION: 1.  The left L5 pedicle screw extends laterally beyond the bone and
just below the traversing exiting left L4 nerve root.
2.  Interval PLIF at L3-4 and L5-S1 without residual stenosis at
either level.
3.  Similar appearance of leftward disc herniation at L2-3 with
mild foraminal narrowing bilaterally, left greater than right.
4.  No significant to new right-sided disease to explain the
patient's symptoms.

## 2018-08-21 ENCOUNTER — Other Ambulatory Visit: Payer: Self-pay | Admitting: Orthopaedic Surgery

## 2018-08-21 ENCOUNTER — Telehealth: Payer: Self-pay | Admitting: Nurse Practitioner

## 2018-08-21 DIAGNOSIS — M4326 Fusion of spine, lumbar region: Secondary | ICD-10-CM

## 2018-09-01 ENCOUNTER — Other Ambulatory Visit: Payer: Self-pay

## 2018-09-01 ENCOUNTER — Inpatient Hospital Stay
Admission: RE | Admit: 2018-09-01 | Discharge: 2018-09-01 | Disposition: A | Discharge status: Home / Self Care | Payer: Self-pay | Source: Ambulatory Visit | Attending: Orthopaedic Surgery | Admitting: Orthopaedic Surgery

## 2018-09-01 NOTE — Discharge Instructions (Signed)

## 2018-09-16 ENCOUNTER — Ambulatory Visit
Admission: RE | Admit: 2018-09-16 | Discharge: 2018-09-16 | Disposition: A | Discharge status: Home / Self Care | Payer: Medicare Other | Source: Ambulatory Visit | Attending: Orthopaedic Surgery | Admitting: Orthopaedic Surgery

## 2018-09-16 ENCOUNTER — Other Ambulatory Visit: Payer: Self-pay

## 2018-09-16 DIAGNOSIS — M4326 Fusion of spine, lumbar region: Secondary | ICD-10-CM

## 2018-09-16 MED ORDER — DIAZEPAM 5 MG PO TABS
5.0000 mg | ORAL_TABLET | Freq: Once | ORAL | Status: AC
  Administered 2018-09-16: 5 mg via ORAL

## 2018-09-16 MED ORDER — IOPAMIDOL (ISOVUE-M 200) INJECTION 41%
15.0000 mL | Freq: Once | INTRAMUSCULAR | Status: AC
  Administered 2018-09-16: 15 mL via INTRATHECAL

## 2018-09-16 NOTE — Discharge Instructions (Signed)
